# Patient Record
Sex: Female | Born: 1935 | Race: Black or African American | Hispanic: No | Smoking: Never smoker
Health system: Southern US, Community
[De-identification: ages and names within clinical notes are randomized; demographics above are authoritative.]

## PROBLEM LIST (undated history)

## (undated) DIAGNOSIS — N289 Disorder of kidney and ureter, unspecified: Secondary | ICD-10-CM

## (undated) DIAGNOSIS — I509 Heart failure, unspecified: Secondary | ICD-10-CM

## (undated) DIAGNOSIS — I10 Essential (primary) hypertension: Secondary | ICD-10-CM

## (undated) DIAGNOSIS — I4891 Unspecified atrial fibrillation: Secondary | ICD-10-CM

## (undated) DIAGNOSIS — N189 Chronic kidney disease, unspecified: Secondary | ICD-10-CM

## (undated) DIAGNOSIS — I499 Cardiac arrhythmia, unspecified: Secondary | ICD-10-CM

## (undated) DIAGNOSIS — E669 Obesity, unspecified: Secondary | ICD-10-CM

## (undated) DIAGNOSIS — E119 Type 2 diabetes mellitus without complications: Secondary | ICD-10-CM

## (undated) HISTORY — PX: FRACTURE SURGERY: SHX138

## (undated) HISTORY — PX: HIP FRACTURE SURGERY: SHX118

## (undated) HISTORY — PX: TUBAL LIGATION: SHX77

---

## 2002-05-18 ENCOUNTER — Emergency Department (HOSPITAL_COMMUNITY): Admission: EM | Admit: 2002-05-18 | Discharge: 2002-05-18 | Payer: Self-pay | Admitting: Emergency Medicine

## 2002-10-18 ENCOUNTER — Ambulatory Visit (HOSPITAL_COMMUNITY): Admission: RE | Admit: 2002-10-18 | Discharge: 2002-10-18 | Payer: Self-pay | Admitting: *Deleted

## 2002-10-18 ENCOUNTER — Encounter: Payer: Self-pay | Admitting: *Deleted

## 2003-01-23 ENCOUNTER — Emergency Department (HOSPITAL_COMMUNITY): Admission: EM | Admit: 2003-01-23 | Discharge: 2003-01-23 | Payer: Self-pay | Admitting: Emergency Medicine

## 2004-06-15 ENCOUNTER — Emergency Department (HOSPITAL_COMMUNITY): Admission: EM | Admit: 2004-06-15 | Discharge: 2004-06-15 | Payer: Self-pay | Admitting: Emergency Medicine

## 2004-07-19 ENCOUNTER — Emergency Department (HOSPITAL_COMMUNITY): Admission: EM | Admit: 2004-07-19 | Discharge: 2004-07-19 | Payer: Self-pay | Admitting: Emergency Medicine

## 2005-10-15 ENCOUNTER — Emergency Department (HOSPITAL_COMMUNITY): Admission: EM | Admit: 2005-10-15 | Discharge: 2005-10-15 | Payer: Self-pay | Admitting: Emergency Medicine

## 2008-03-03 ENCOUNTER — Emergency Department (HOSPITAL_COMMUNITY): Admission: EM | Admit: 2008-03-03 | Discharge: 2008-03-03 | Payer: Self-pay | Admitting: Emergency Medicine

## 2008-03-05 ENCOUNTER — Inpatient Hospital Stay (HOSPITAL_COMMUNITY): Admission: EM | Admit: 2008-03-05 | Discharge: 2008-03-07 | Payer: Self-pay | Admitting: Emergency Medicine

## 2008-10-27 ENCOUNTER — Inpatient Hospital Stay: Payer: Self-pay | Admitting: Unknown Physician Specialty

## 2008-11-01 ENCOUNTER — Inpatient Hospital Stay: Admission: RE | Admit: 2008-11-01 | Discharge: 2008-12-11 | Payer: Self-pay | Admitting: Internal Medicine

## 2008-11-06 ENCOUNTER — Ambulatory Visit (HOSPITAL_COMMUNITY): Payer: Self-pay | Admitting: Internal Medicine

## 2008-11-06 ENCOUNTER — Encounter (HOSPITAL_COMMUNITY): Admission: RE | Admit: 2008-11-06 | Discharge: 2008-11-07 | Payer: Self-pay | Admitting: Internal Medicine

## 2009-05-26 ENCOUNTER — Emergency Department (HOSPITAL_COMMUNITY): Admission: EM | Admit: 2009-05-26 | Discharge: 2009-05-26 | Payer: Self-pay | Admitting: Emergency Medicine

## 2010-11-03 ENCOUNTER — Encounter: Payer: Self-pay | Admitting: Nurse Practitioner

## 2011-01-11 IMAGING — CR DG HIP COMPLETE 2+V*L*
1 series · 2 of 2 positions shown · non-contrast
Comparison: none

REASON FOR EXAM: fell on [REDACTED],c/o left hip pain
COMMENTS:   LMP: Post-Menopausal

PROCEDURE:     DXR - DXR HIP LEFT COMPLETE  - October 27, 2008  [DATE]
RESULT:     A subcapital femoral neck fracture is appreciated demonstrating
impaction and lateral angulation.

[Series 1: view not recorded · 0.17mm/px · 2 of 2 slices shown]
[im 1/2]
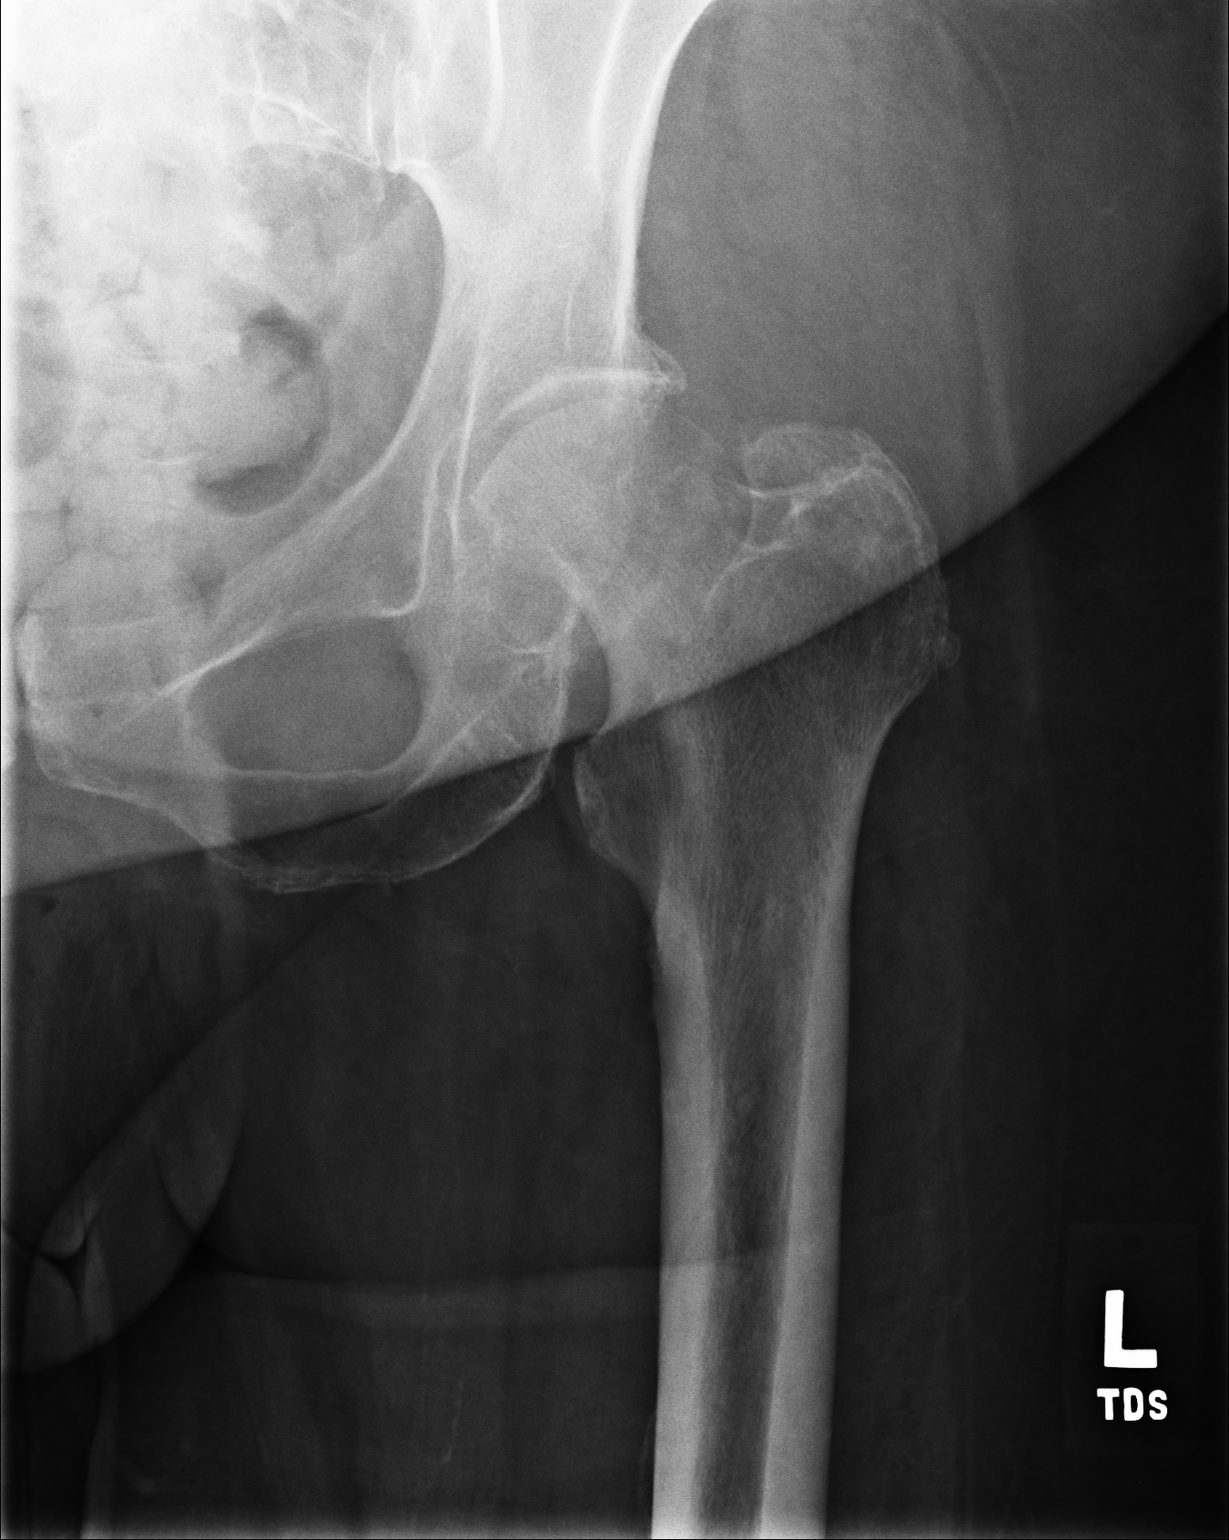
[im 2/2]
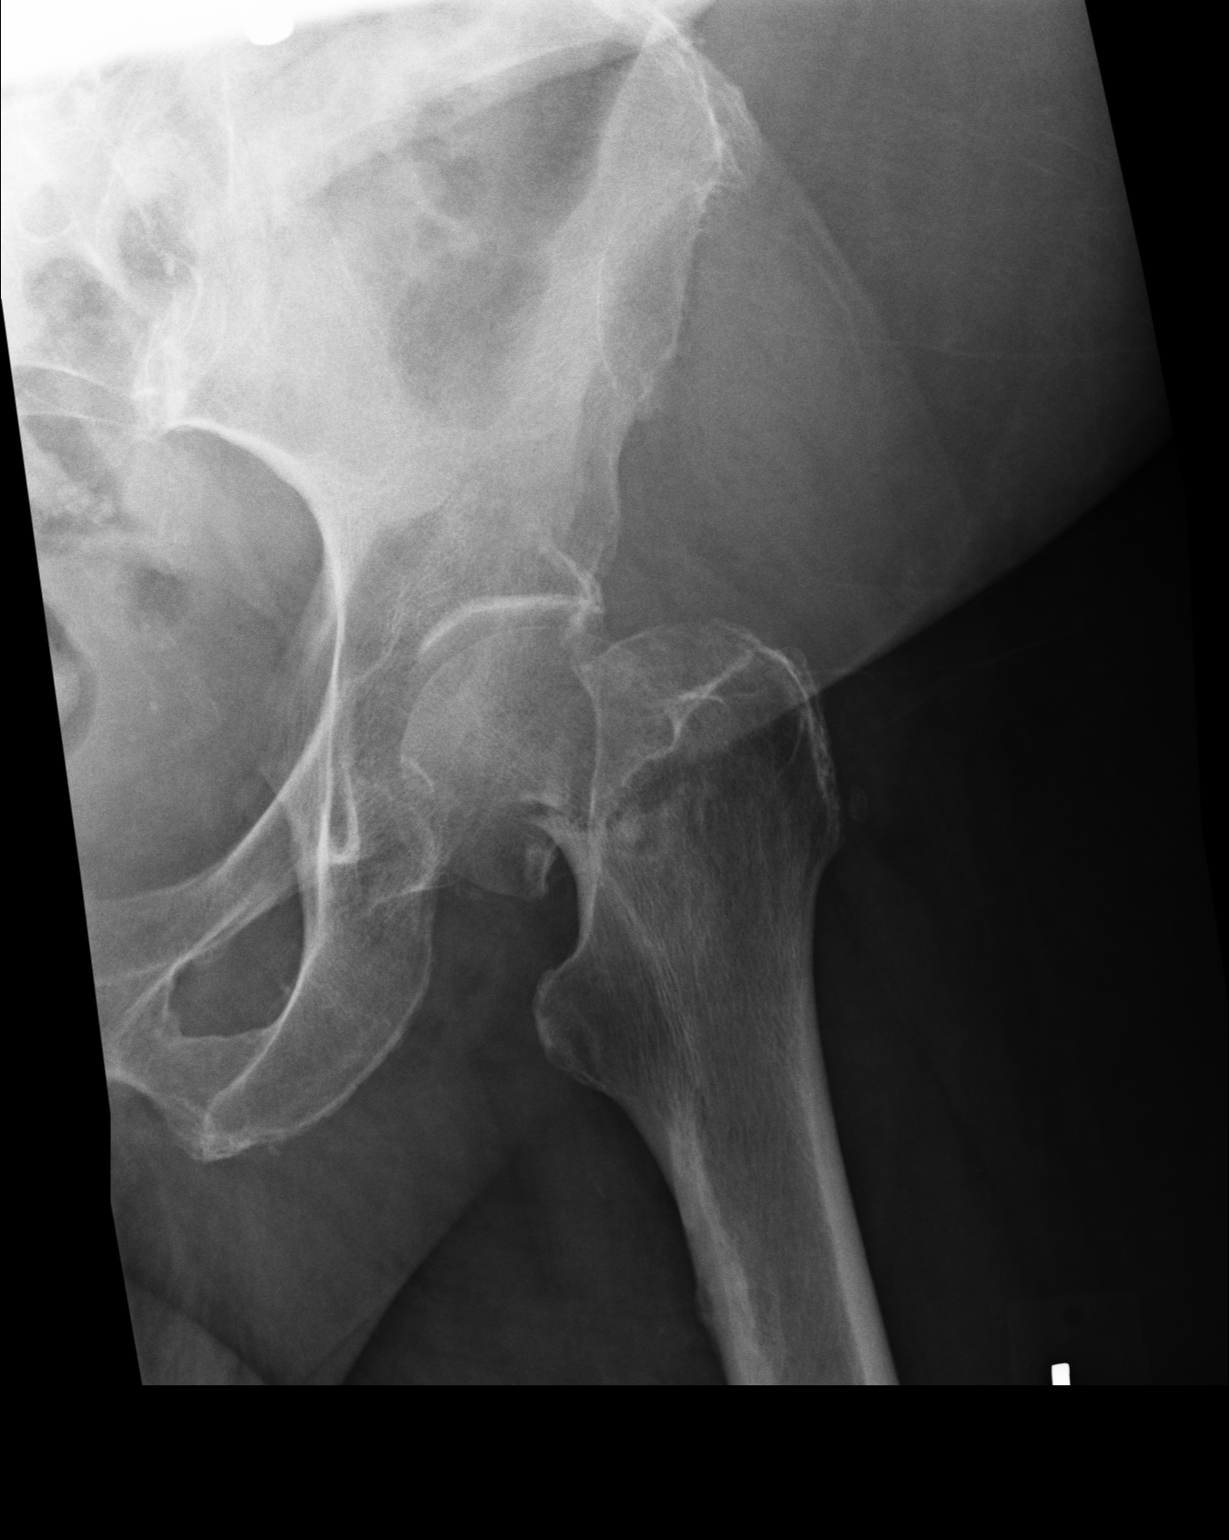

[2 of 2 positions shown; findings below may reference images not displayed]

IMPRESSION: 1. Subcapital femoral neck fracture as described above.

## 2011-01-11 IMAGING — CR PELVIS - 1-2 VIEW
1 series · 1 of 1 positions shown · non-contrast
Comparison: none

REASON FOR EXAM: fell [REDACTED] c/o left hip pain [HOSPITAL]
COMMENTS:

PROCEDURE:     DXR - DXR PELVIS AP ONLY  - October 27, 2008  [DATE]
RESULT:     A subcapital femoral neck fracture is appreciated involving the
LEFT hip. No further fractures or dislocations are appreciated. A moderate
amount of stool is appreciated within the colon.

[view not recorded]
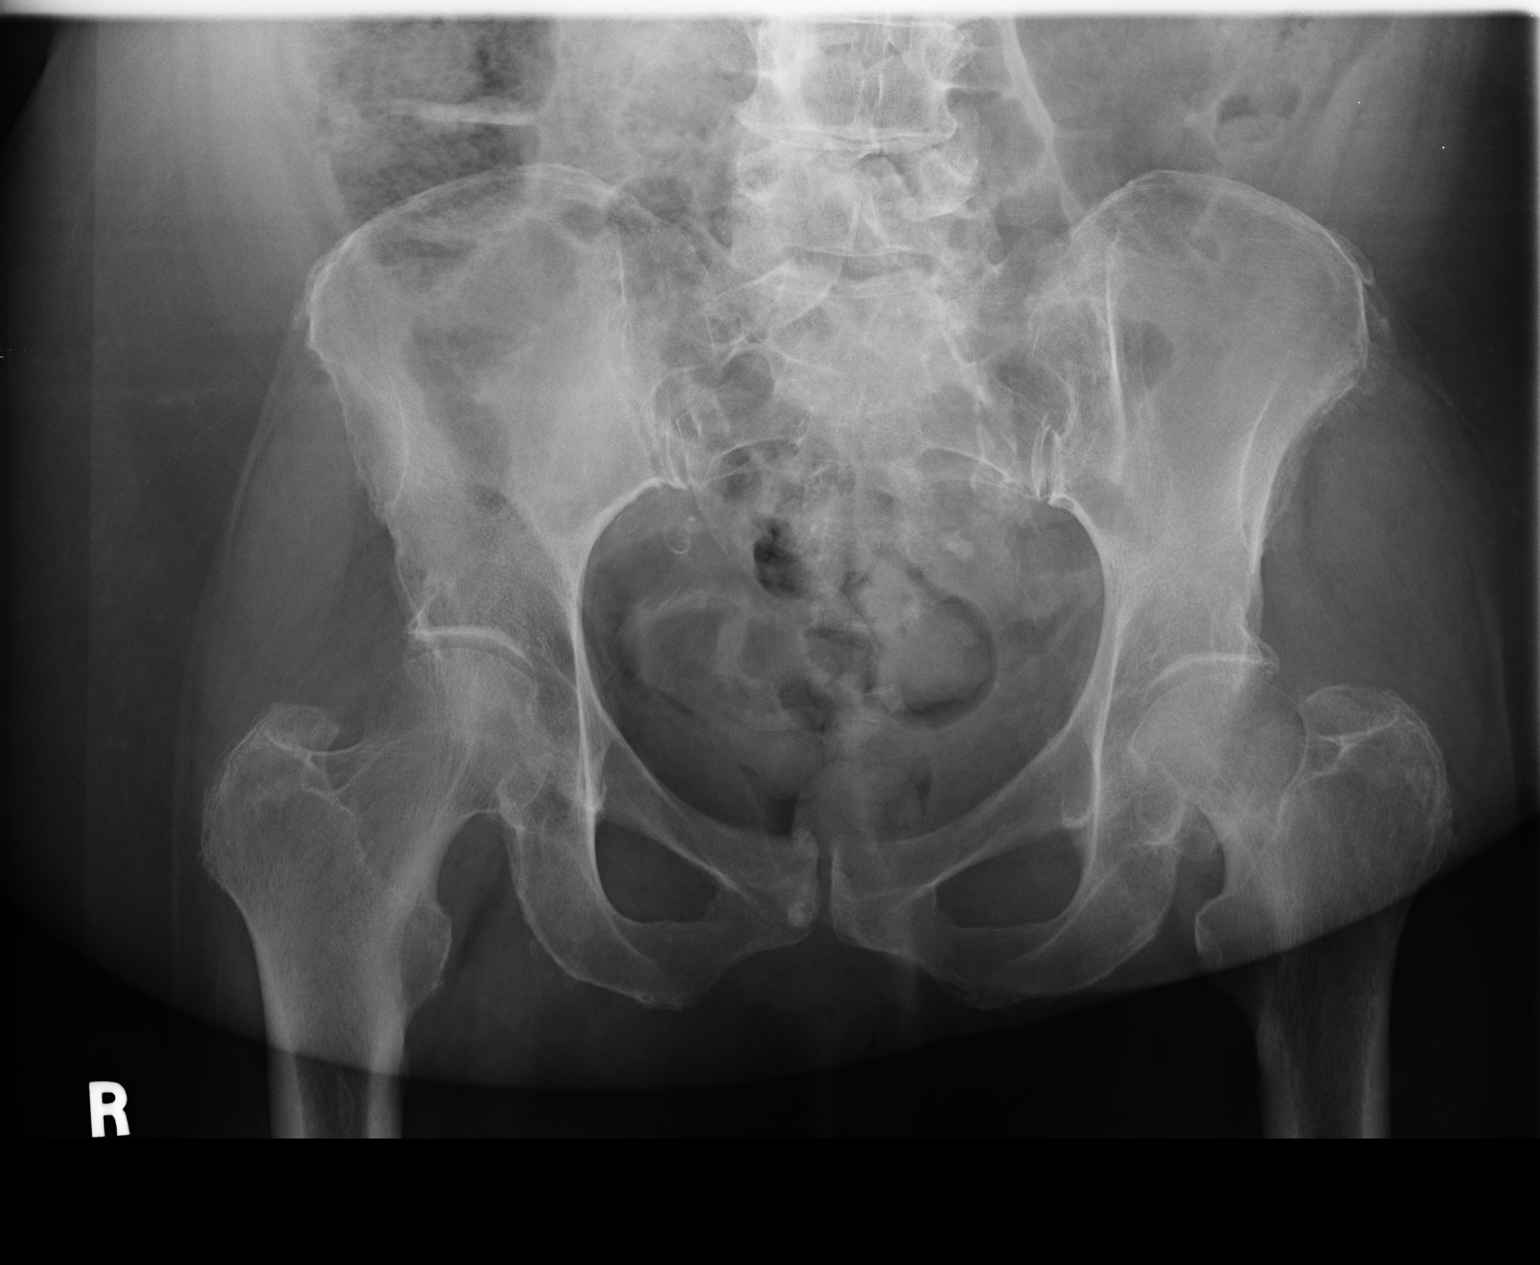

[1 of 1 positions shown; findings below may reference images not displayed]

IMPRESSION: 1. LEFT subcapital femoral neck fracture.

## 2011-01-11 IMAGING — CR DG CHEST 1V PORT
1 series · 1 of 1 positions shown · non-contrast
Comparison: none

REASON FOR EXAM: pre op hip fx  rm 17
COMMENTS:

PROCEDURE:     DXR - DXR PORTABLE CHEST SINGLE VIEW  - October 27, 2008  [DATE]
RESULT:     No focal regions of consolidation or focal infiltrates are
appreciated. The cardiac silhouette is moderately enlarged. The visualized
bony skeleton is unremarkable.

[view not recorded]
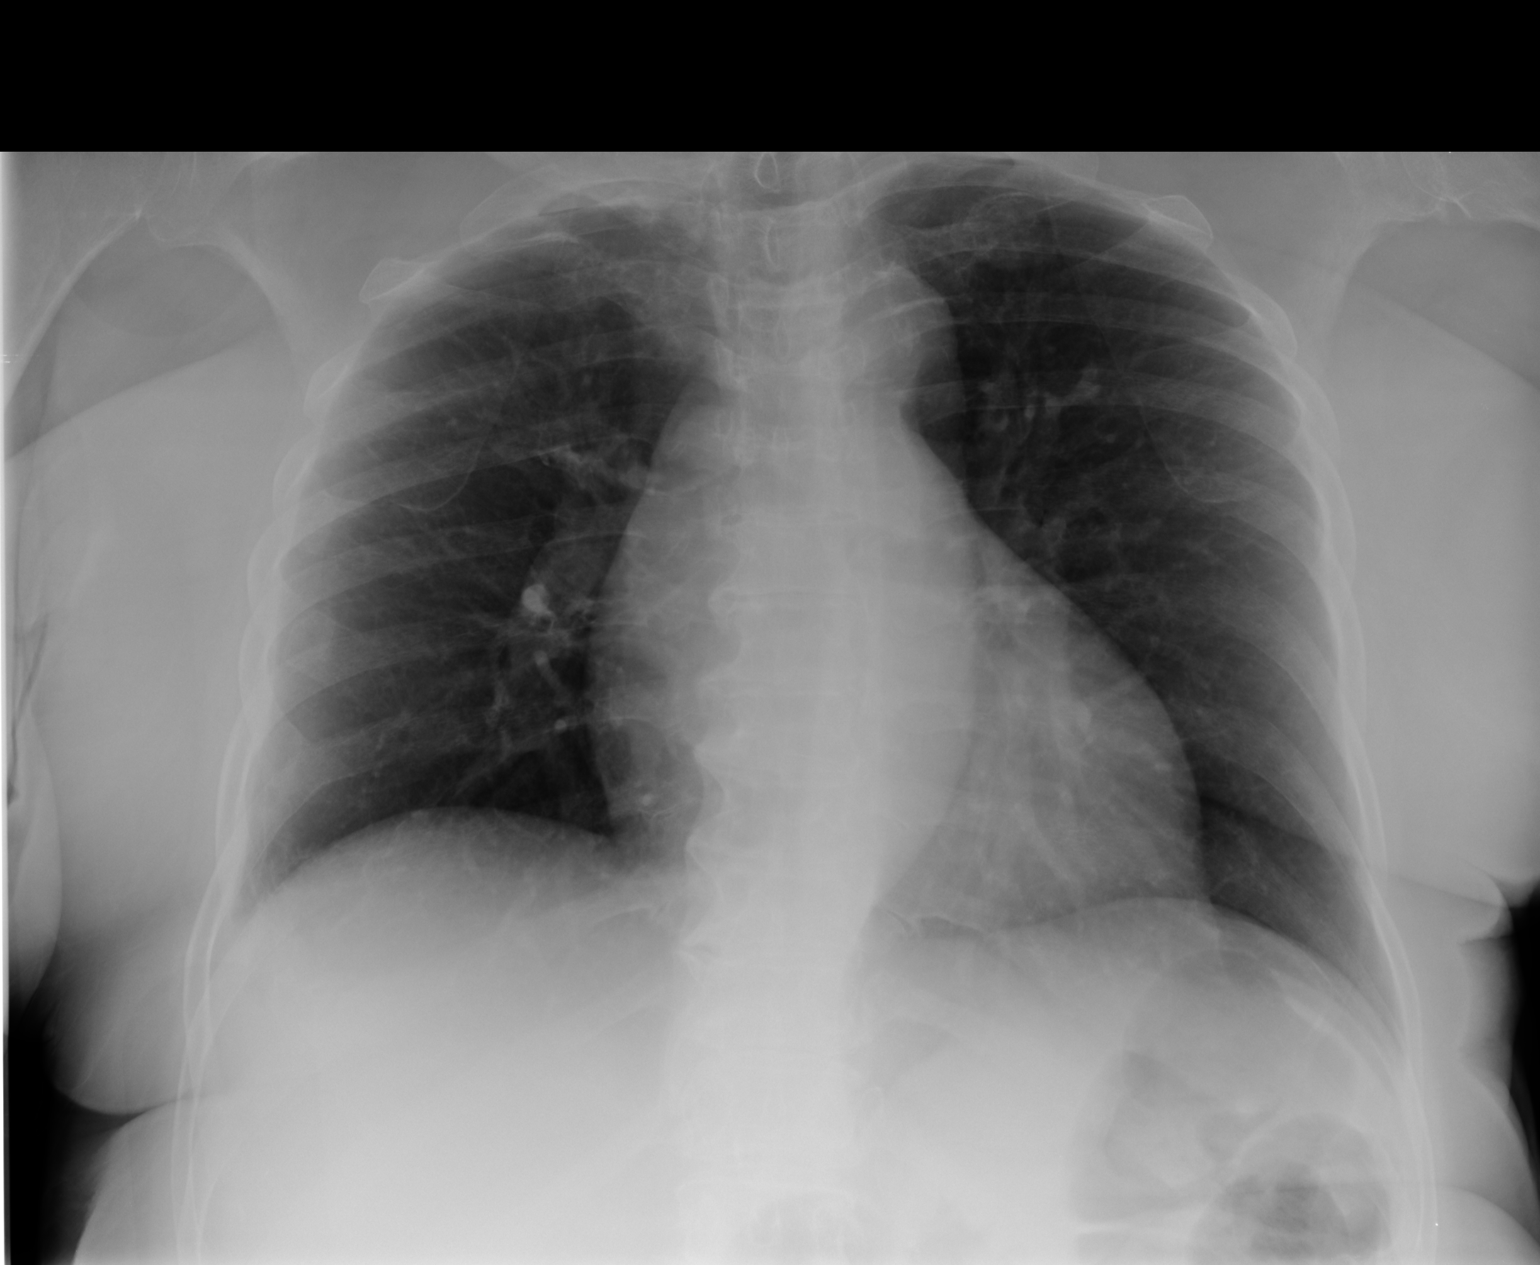

[1 of 1 positions shown; findings below may reference images not displayed]

IMPRESSION: 1. Chest radiograph without evidence of acute cardiopulmonary disease.

## 2011-01-12 IMAGING — CR PELVIS - 1-2 VIEW
1 series · 1 of 1 positions shown · non-contrast
Comparison: none

REASON FOR EXAM: postop
COMMENTS:

PROCEDURE:     DXR - DXR PELVIS AP ONLY  - October 28, 2008  [DATE]
RESULT:     The patient is status post LEFT hip prosthesis. The hardware
appears intact without evidence of loosening or failure. The native osseous
structures demonstrate no evidence of fracture or dislocation.

[view not recorded]
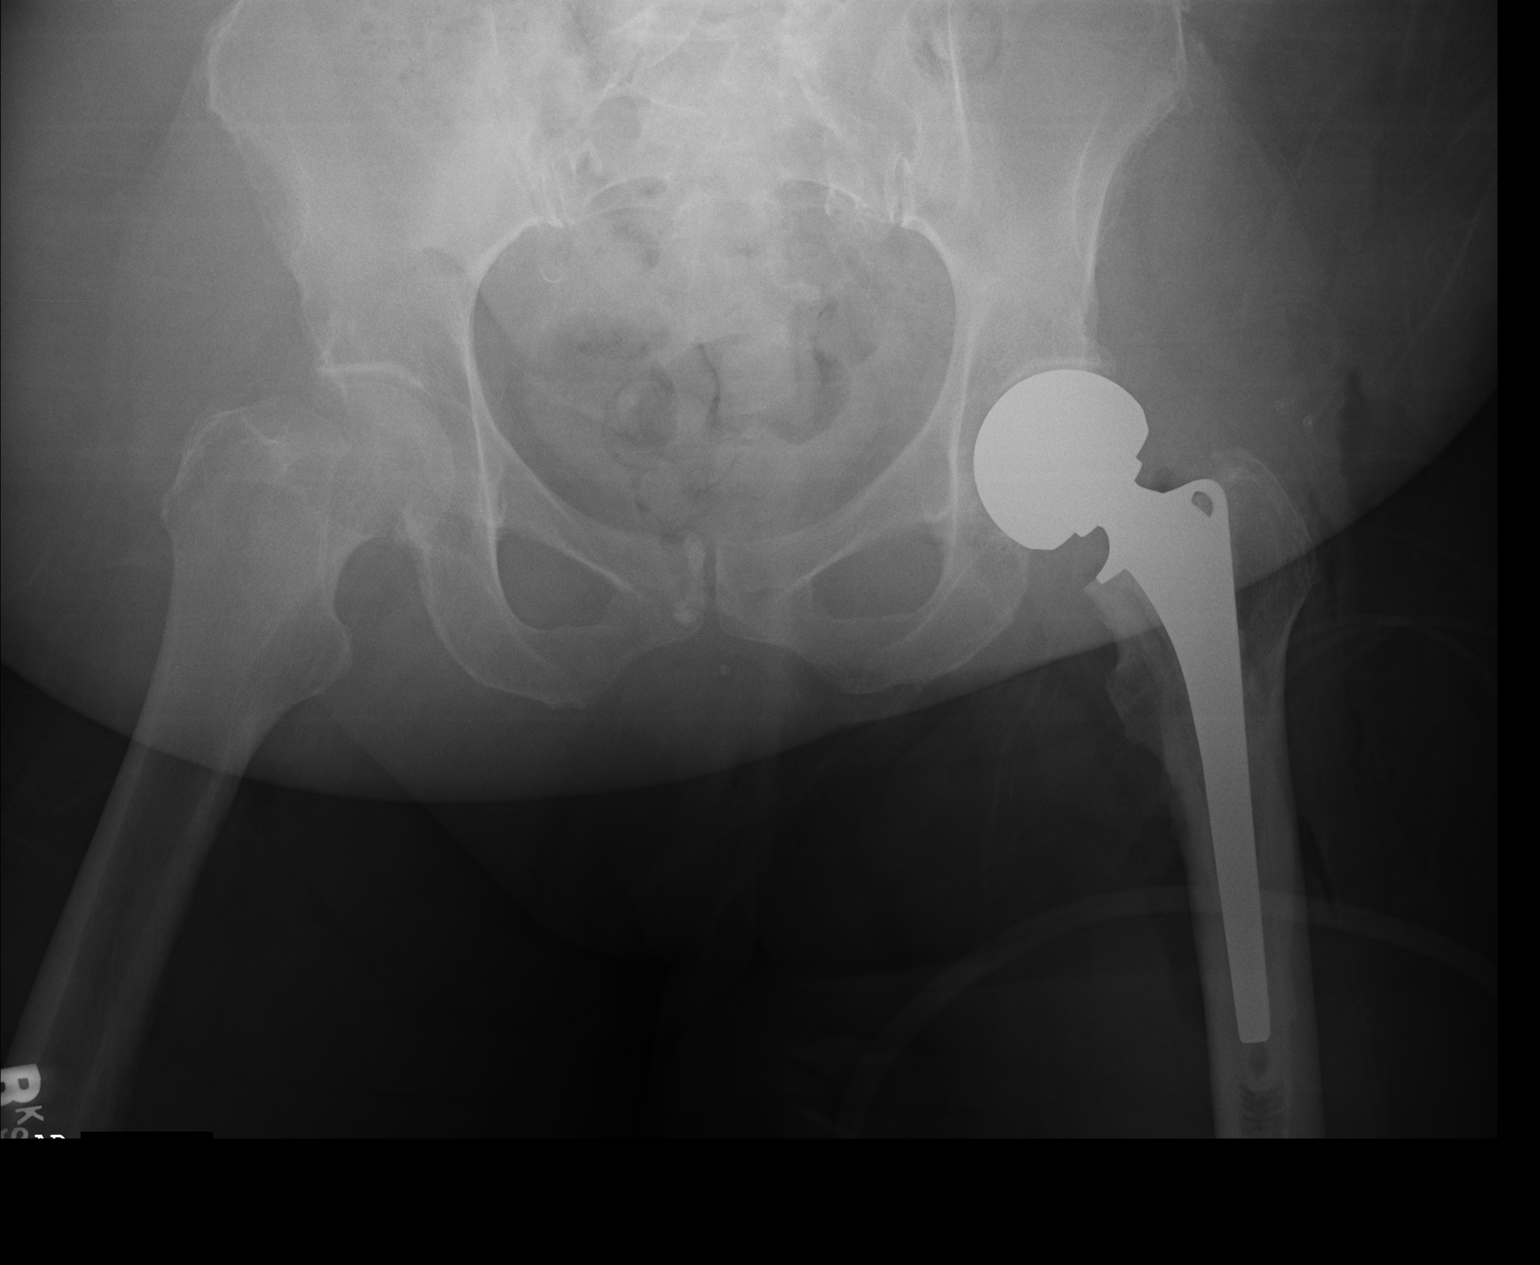

[1 of 1 positions shown; findings below may reference images not displayed]

IMPRESSION: 1. LEFT hip prosthesis as described above.

## 2011-01-22 LAB — GLUCOSE, CAPILLARY

## 2011-01-26 LAB — GLUCOSE, CAPILLARY
Comment 1: 254331
Comment 1: 254331
Glucose-Capillary: 145 mg/dL — ABNORMAL HIGH (ref 70–99)
Glucose-Capillary: 151 mg/dL — ABNORMAL HIGH (ref 70–99)
Glucose-Capillary: 173 mg/dL — ABNORMAL HIGH (ref 70–99)
Glucose-Capillary: 174 mg/dL — ABNORMAL HIGH (ref 70–99)
Glucose-Capillary: 180 mg/dL — ABNORMAL HIGH (ref 70–99)
Glucose-Capillary: 197 mg/dL — ABNORMAL HIGH (ref 70–99)
Glucose-Capillary: 214 mg/dL — ABNORMAL HIGH (ref 70–99)
Glucose-Capillary: 222 mg/dL — ABNORMAL HIGH (ref 70–99)
Glucose-Capillary: 223 mg/dL — ABNORMAL HIGH (ref 70–99)
Glucose-Capillary: 225 mg/dL — ABNORMAL HIGH (ref 70–99)
Glucose-Capillary: 251 mg/dL — ABNORMAL HIGH (ref 70–99)
Glucose-Capillary: 260 mg/dL — ABNORMAL HIGH (ref 70–99)
Glucose-Capillary: 268 mg/dL — ABNORMAL HIGH (ref 70–99)
Glucose-Capillary: 276 mg/dL — ABNORMAL HIGH (ref 70–99)
Glucose-Capillary: 278 mg/dL — ABNORMAL HIGH (ref 70–99)
Glucose-Capillary: 281 mg/dL — ABNORMAL HIGH (ref 70–99)
Glucose-Capillary: 282 mg/dL — ABNORMAL HIGH (ref 70–99)
Glucose-Capillary: 294 mg/dL — ABNORMAL HIGH (ref 70–99)
Glucose-Capillary: 310 mg/dL — ABNORMAL HIGH (ref 70–99)
Glucose-Capillary: 314 mg/dL — ABNORMAL HIGH (ref 70–99)
Glucose-Capillary: 315 mg/dL — ABNORMAL HIGH (ref 70–99)
Glucose-Capillary: 320 mg/dL — ABNORMAL HIGH (ref 70–99)
Glucose-Capillary: 47 mg/dL — ABNORMAL LOW (ref 70–99)
Glucose-Capillary: 488 mg/dL — ABNORMAL HIGH (ref 70–99)
Glucose-Capillary: 57 mg/dL — ABNORMAL LOW (ref 70–99)
Glucose-Capillary: 59 mg/dL — ABNORMAL LOW (ref 70–99)
Glucose-Capillary: 61 mg/dL — ABNORMAL LOW (ref 70–99)
Glucose-Capillary: 72 mg/dL (ref 70–99)
Glucose-Capillary: 73 mg/dL (ref 70–99)
Glucose-Capillary: 90 mg/dL (ref 70–99)
Glucose-Capillary: 97 mg/dL (ref 70–99)

## 2011-01-26 LAB — CROSSMATCH

## 2011-01-26 LAB — ABO/RH: ABO/RH(D): O POS

## 2011-01-26 LAB — HEMOGLOBIN AND HEMATOCRIT, BLOOD: HCT: 26 % — ABNORMAL LOW (ref 36.0–46.0)

## 2011-01-27 LAB — GLUCOSE, CAPILLARY
Glucose-Capillary: 108 mg/dL — ABNORMAL HIGH (ref 70–99)
Glucose-Capillary: 111 mg/dL — ABNORMAL HIGH (ref 70–99)
Glucose-Capillary: 119 mg/dL — ABNORMAL HIGH (ref 70–99)
Glucose-Capillary: 120 mg/dL — ABNORMAL HIGH (ref 70–99)
Glucose-Capillary: 121 mg/dL — ABNORMAL HIGH (ref 70–99)
Glucose-Capillary: 125 mg/dL — ABNORMAL HIGH (ref 70–99)
Glucose-Capillary: 137 mg/dL — ABNORMAL HIGH (ref 70–99)
Glucose-Capillary: 138 mg/dL — ABNORMAL HIGH (ref 70–99)
Glucose-Capillary: 150 mg/dL — ABNORMAL HIGH (ref 70–99)
Glucose-Capillary: 164 mg/dL — ABNORMAL HIGH (ref 70–99)
Glucose-Capillary: 171 mg/dL — ABNORMAL HIGH (ref 70–99)
Glucose-Capillary: 180 mg/dL — ABNORMAL HIGH (ref 70–99)
Glucose-Capillary: 183 mg/dL — ABNORMAL HIGH (ref 70–99)
Glucose-Capillary: 189 mg/dL — ABNORMAL HIGH (ref 70–99)
Glucose-Capillary: 198 mg/dL — ABNORMAL HIGH (ref 70–99)
Glucose-Capillary: 203 mg/dL — ABNORMAL HIGH (ref 70–99)
Glucose-Capillary: 215 mg/dL — ABNORMAL HIGH (ref 70–99)
Glucose-Capillary: 216 mg/dL — ABNORMAL HIGH (ref 70–99)
Glucose-Capillary: 221 mg/dL — ABNORMAL HIGH (ref 70–99)
Glucose-Capillary: 237 mg/dL — ABNORMAL HIGH (ref 70–99)
Glucose-Capillary: 246 mg/dL — ABNORMAL HIGH (ref 70–99)
Glucose-Capillary: 254 mg/dL — ABNORMAL HIGH (ref 70–99)
Glucose-Capillary: 255 mg/dL — ABNORMAL HIGH (ref 70–99)
Glucose-Capillary: 297 mg/dL — ABNORMAL HIGH (ref 70–99)
Glucose-Capillary: 301 mg/dL — ABNORMAL HIGH (ref 70–99)
Glucose-Capillary: 389 mg/dL — ABNORMAL HIGH (ref 70–99)
Glucose-Capillary: 60 mg/dL — ABNORMAL LOW (ref 70–99)
Glucose-Capillary: 82 mg/dL (ref 70–99)
Glucose-Capillary: 100 mg/dL — ABNORMAL HIGH (ref 70–99)
Glucose-Capillary: 110 mg/dL — ABNORMAL HIGH (ref 70–99)
Glucose-Capillary: 111 mg/dL — ABNORMAL HIGH (ref 70–99)
Glucose-Capillary: 112 mg/dL — ABNORMAL HIGH (ref 70–99)
Glucose-Capillary: 170 mg/dL — ABNORMAL HIGH (ref 70–99)
Glucose-Capillary: 196 mg/dL — ABNORMAL HIGH (ref 70–99)
Glucose-Capillary: 208 mg/dL — ABNORMAL HIGH (ref 70–99)
Glucose-Capillary: 72 mg/dL (ref 70–99)
Glucose-Capillary: 89 mg/dL (ref 70–99)

## 2011-02-24 NOTE — Discharge Summary (Signed)
NAME:  [name], [name]                 ACCOUNT NO.:  [id number]   MEDICAL RECORD NO.:  [id number]          PATIENT TYPE:  INP   LOCATION:  A326                          FACILITY:  [facility]   PHYSICIAN:  Dorris Singh, DO    DATE OF BIRTH:  1933/01/18   DATE OF ADMISSION:  03/04/2008  DATE OF DISCHARGE:  05/27/2009LH                               DISCHARGE SUMMARY   ADMISSION DIAGNOSES:  1. Hypoglycemia with blood sugar as low as 18.  2. Recurrent fall secondary to hypoglycemia.  3. Type 2 diabetes.  4. Rib fractures.   DISCHARGE DIAGNOSES:  1. Hyperglycemia which is resolved.  2. Right rib fractures.  3. Diabetes.   PRIMARY CARE PHYSICIAN:  [facility].   RADIOLOGY TESTS:  That were done include on May 25 unilateral rib which  showed acute right rib fractures and extrapleural hematoma on the right.  Portable chest x-ray on May 26 demonstrated rib fractures again noted,  no pneumothorax, cardiomegaly and basilar atelectasis.  A chest x-ray on  May 27 demonstrated stable right effusion and pleural thickening.  No  pneumothorax with known rib fractures.   Her H and P was done by Dr. Rito Ehrlich.  To summarize, the patient is a 77-  year-old [place] female who experienced several episodes of low  blood sugars.  She came to [facility] and then was sent home and then  returned with a complaint of hypoglycemia.  Also stated that she noticed  that this had started to occur once her Lantus dose was changed and  increased.  The patient was then started on D5 IV fluids.  Also she was  taken off all her hypoglycemic agents.  At that point in time she  continued to do well.  She experienced some pain with her rib fractures  but that has improved.  Today her blood sugars have been over 140, also  with the highest being at 395.  She has been covered on a sliding scale.  The patient is stable enough to discharge back to home.   MEDICATIONS:  1. Her medications that she will go home on  include aspirin 81 mg p.o.      daily.  2. Metformin 500 mg two p.o. b.i.d.  3. Fexofenadine 180 mg as needed.  4. HCTZ 25 mg daily.  5. Glipizide 10 mg twice a day.  6. Lisinopril 40 mg daily.   The change in her medication will be Lantus 30 units in the a.m. and 30  units at night.  This is only a temporary measure until she is seen by  her primary care physician to adjust her medication accordingly possibly  with more oral hyperglycemics due to the patient's intolerance to the  current Lantus dose upon admission.   CONDITION:  Stable.  Her vitals are stable and her lab work is  acceptable for discharge.   DISCHARGE PLAN:  Will be for the patient to follow up with primary care  physician in 1-3 days.  We will set up an appointment for her.  Also to  come back if any  problems worsen for her rib fractures.  She will use  Tylenol for her rib fracture, she will use Tylenol for pain as directed.  Also no heavy lifting for up to 6 weeks and will make sure the patient  has an incentive spirometer.  I have recommended home health care to the  patient at this point in time to help her with activities of daily  living.  She has refused.  She states that she has family member that  can help her.   DISPOSITION:  To home.      Dorris Singh, DO  Electronically Signed     CB/MEDQ  D:  03/07/2008  T:  03/07/2008  Job:  [id number]   cc:   [facility]

## 2011-02-24 NOTE — H&P (Signed)
NAME:  [name] [name], [name] [name]                 ACCOUNT NO.:  [id number]   MEDICAL RECORD NO.:  [id number]          PATIENT TYPE:  INP   LOCATION:  A326                          FACILITY:  [facility]   PHYSICIAN:  Margaretmary Dys, M.D.DATE OF BIRTH:  [birth year]   DATE OF ADMISSION:  03/05/2008  DATE OF DISCHARGE:  LH                              HISTORY & PHYSICAL   PRIMARY CARE PHYSICIAN:  [facility].   ADMITTING DIAGNOSES:  1. Hypoglycemia with blood sugar as low as 18 at home today.  2. Recurrent falls secondary to hypoglycemia.  3. Type 2 diabetes mellitus.   CHIEF COMPLAINT:  Low blood sugar and recurrent falls over the past 5  days.   HISTORY OF PRESENT ILLNESS:  Ms. Boies is a 77 year old female who was  brought into the emergency room by the EMS.  Apparently, the patient  lives with her daughter and over the last 5 days since last Wednesday to  be exact, the patient has noticed that she has been having low blood  sugars.  The lowest was 18.  She has also had 42 at various times.  She  said these episodes are accompanied by light headedness and she actually  took a couple of falls.  She presented to the hospital on Mar 03, 2008,  at which time she was discharged.  However, the patient presented again  yesterday after she was brought in by the EMS.  The patient recently had  her Lantus insulin changed and a higher dose prescribed.  This was about  2 weeks ago.  The patient suspects that ever since then she has been  having these symptoms of light headedness and symptoms very suggestive  of hypoglycemia.  The patient has been a diabetic since 1986, and has  not had any significant hospital admissions and overall has been doing  fairly well and remains very active and working.  The patient denies any  chest pain.  She has no nausea or vomiting.  She has no abdominal pain.  She has no headaches.  She has had no fevers or chills, no rigors, no  frequency, urgency or  dysuria.   She has no recent weight loss, no generalized weakness or change in her  appetite.  She denies any cough.  No dyspnea, no wheezing, no tachypnea.   REVIEW OF SYSTEMS:  Ten point of review of systems otherwise negative,  except as mentioned in the history of present illness above.   PAST MEDICAL HISTORY:  1. Hypertension.  2. Diabetes.  3. Dyslipidemia.  4. Morbid obesity.   MEDICATIONS:  1. Lantus insulin subcutaneous 65 units in the morning and 50 units in      the evening.  2. Lisinopril 40 mg p.o. once a day.  3. Metformin 1000 mg p.o. b.i.d.  4. Hydrochlorothiazide 25 mg p.o. q.a.m. in the morning.  5. Fexofenadine 180 mg p.o. p.r.n., this is for allergy.  6. Glipizide 10 mg p.o. b.i.d.  7. Aspirin 81 mg p.o. once a day.  8. Lipitor once a day.   ALLERGIES:  THE PATIENT  HAS NO KNOWN DRUG ALLERGIES.   FAMILY HISTORY:  Father died of a heart attack at age 48.  Mother also  had diabetes and died of complications of diabetes.  No other  significant history of cancer was obtained.  The patient has two other  siblings, one of them has diabetes.   SOCIAL HISTORY:  The patient is single, lives with one of her daughters.  She has five children.  She is a lifelong nonsmoker and does not drink  alcohol.  No history of illicit drug use.  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   PHYSICAL EXAMINATION:  GENERAL:  The patient was conscious, alert,  comfortable, not in acute distress.  She was well oriented in time,  place and person.  Very pleasant.  VITAL SIGNS:  Blood pressure 120/68 with a pulse of 82, respiratory rate  16, temperature was 98.5 degrees Fahrenheit.  Oxygen saturation was 95%  on room air.  HEENT:  Normocephalic, atraumatic.  Oral mucosa was moist with no  exudates.  NECK:  Supple.  No JVD or lymphadenopathy.  LUNGS:  Clear with good air entry bilaterally.  HEART:  S1-S2 regular.  No S3-S4, gallops or rubs.  ABDOMEN:  Obese, but soft.  Nontender.  Bowel  sounds positive.  No  masses palpable.  EXTREMITIES:  Bilateral trace pedal edema with no calf induration or  tenderness noted.  The patient had full range of movement in all  extremities without any evidence of fracture.  No bruises or abrasions  were noted.  CNS:  Grossly intact with no focal neurological deficits.   LABORATORY/DIAGNOSTIC DATA:  White blood cell count was 10.4, hemoglobin  9.6, hematocrit 29.1, platelet count 354 with no left shift.  Sodium  140, potassium 3.5, chloride 106, CO2 was 30, glucose 92, BUN 21,  creatinine was 1.04.  AST 32, ALT 37, albumin 2.8, calcium 9.1.  Please  note that the patient's blood sugar when the EMS arrived in her home was  18.   ASSESSMENT:  1. Recurrent falls secondary to hypoglycemia.  2. Eminent hypoglycemic coma today.  3. Altered mental status secondary to hypoglycemia, the patient was      not aware until she arrived in the hospital.  Her blood sugar was      18 anyway.  4. Severe hypoglycemic episodes.  5. History of diabetes mellitus with recent adjustment in her Lantus      insulin dose.   PLAN:  1. The patient's Lantus insulin and oral hypoglycemic agents will be      put on hold at this time.  The patient said she has been taking the      oral hypoglycemic agents for several years.  The only new change      was her insulin.  I suspect that it is likely due to her insulin,      but until we are able to get her blood sugar to a reasonable level      of at least 200, I will stop all her hypoglycemic medications at      this time.  2. The patient's IV fluids will be changed to 5% dextrose to run at 75      mL.  We will monitor her blood sugars every hour.  I will be      notified if her blood sugar goes below 60, for which we will      activate the hypoglycemic protocol and give her 50% dextrose.  If      it goes above 200, I will stop the dextrose infusion and slowly      restart her back on her insulin at much lower  doses.  3. I will obtaining a hemoglobin A1c on her to see what her control      has been over the past 3 months to ensure that the patient has not      had undesirably aggressive control.  4. The patient does not have any evidence of fractures, bruises or      abrasions from her fall.  5. The patient's neurological status is pretty intact at this time.  I      do not see any evidence of hypoglycemic encephalopathy.  6. The patient to be on DVT prophylaxis with Lovenox.  7. I will resume all of her other medications, including her blood      pressure medications and aspirin.   DISPOSITION:  I have discussed the above plan with the patient.  She was  fairly lucid and she did understand that I be putting her diabetic  medications on hold.  Unfortunately, he daughter was not in the room  when I saw her.  I will discuss with the family members once they are  available.   CODE STATUS:  The patient is a full code.      Margaretmary Dys, M.D.  Electronically Signed     AM/MEDQ  D:  03/05/2008  T:  03/05/2008  Job:  [id number]

## 2011-02-24 NOTE — Group Therapy Note (Signed)
NAME:  [name] [name]                 ACCOUNT NO.:  [id number]   MEDICAL RECORD NO.:  [id number]          PATIENT TYPE:  INP   LOCATION:  A326                          FACILITY:  [facility]   PHYSICIAN:  Osvaldo Shipper, MD     DATE OF BIRTH:  1932-10-22   DATE OF PROCEDURE:  03/06/2008  DATE OF DISCHARGE:                                 PROGRESS NOTE   SUBJECTIVE:  Patient is complaining of severe pain in the right chest  wall.  She denies any shortness of breath at this time.   OBJECTIVE FINDINGS:  Vital signs continue to be stable.  Blood pressure  is little bit high 151/76.  CBGs again dropped last night to 55 and the  last reading is 83 at 6:00 a.m. this morning.  No other labs are  available today.  Chest x-ray from this morning again shows rib  fractures on the right side with small amount of subjacent fluid  compatible with small hemorrhage.  No pneumothorax was noted.  The ribs  involved are 4, 5 and 6.   ASSESSMENT/PLAN:  1. Hypoglycemia.  This is thought to be secondary to a higher dose of      Lantus and change in the brand of the insulin, glargine, that was      used recently on this patient.  She continues to be hypoglycemic      off of the D5.  So she was reinitiated on the D5 last night.  We      will continue with this treatment of regimen this day.  2. Rib fractures because of a fall that she sustained when she was      hypoglycemic and had a syncopal episode.  No pneumothorax has been      noted.  We will give her around-the-clock pain medication so that      pain is controlled.  She is an elderly patient and is at risk for      pneumonia.  I do not want to utilize any nonsteroidal anti-      inflammatory drugs as she is at risk for renal failure.  The above      has been discussed with the patient.  3. Insulin-dependent diabetes, stable.  Her HbA1c is 11, so her      diabetes has not been well controlled an outpatient but obviously      the Lantus caused significant  hypoglycemia, so another treatment      regimen may have to be considered.  I think I would probably start      out with a lower dose of Lantus when she is no longer hypoglycemic.      She was also on oral hypoglycemic agents which are also held at      this time.  4. She also has a history of hypertension which is stable.  Otherwise      she is a fairly healthy person.   Because of the small amount of hemorrhage noted on the chest x-ray, we  will go ahead and discontinue her Lovenox and aspirin  so as not to  exacerbate these hemorrhages.   The patient will have to be changed from an observation to admit and we  will continue to follow her and I am hoping she will be able to go home  in the next day or two.      Osvaldo Shipper, MD  Electronically Signed     GK/MEDQ  D:  03/06/2008  T:  03/06/2008  Job:  [id number]

## 2011-07-08 LAB — DIFFERENTIAL
Basophils Absolute: 0
Basophils Absolute: 0
Basophils Relative: 0
Eosinophils Absolute: 0.2
Lymphocytes Relative: 21
Lymphs Abs: 1.9
Monocytes Relative: 10
Neutro Abs: 6
Neutro Abs: 6.7
Neutrophils Relative %: 64

## 2011-07-08 LAB — HEMOGLOBIN A1C: Hgb A1c MFr Bld: 11 — ABNORMAL HIGH

## 2011-07-08 LAB — CBC
HCT: 29.1 — ABNORMAL LOW
Hemoglobin: 9.6 — ABNORMAL LOW
Hemoglobin: 9.7 — ABNORMAL LOW
MCHC: 33.1
Platelets: 337
RBC: 3.55 — ABNORMAL LOW
RDW: 15.9 — ABNORMAL HIGH
WBC: 8.8

## 2011-07-08 LAB — BASIC METABOLIC PANEL
BUN: 19
CO2: 27
Calcium: 8.8
Calcium: 8.9
Calcium: 9.1
Chloride: 111
Creatinine, Ser: 0.92
GFR calc Af Amer: >60
GFR calc Af Amer: >60
GFR calc non Af Amer: 58 — ABNORMAL LOW
GFR calc non Af Amer: 60 — ABNORMAL LOW
Glucose, Bld: 87
Potassium: 3.7
Sodium: 135
Sodium: 138
Sodium: 143

## 2011-07-08 LAB — COMPREHENSIVE METABOLIC PANEL
ALT: 37 — ABNORMAL HIGH
Alkaline Phosphatase: 77
BUN: 21
CO2: 30
Chloride: 106
Glucose, Bld: 92
Potassium: 3.5
Sodium: 140
Total Bilirubin: 0.3

## 2011-07-08 LAB — GLUCOSE, RANDOM: Glucose, Bld: 373 — ABNORMAL HIGH

## 2015-02-25 ENCOUNTER — Emergency Department (HOSPITAL_COMMUNITY): Payer: Medicare HMO

## 2015-02-25 ENCOUNTER — Encounter (HOSPITAL_COMMUNITY): Payer: Self-pay | Admitting: *Deleted

## 2015-02-25 ENCOUNTER — Inpatient Hospital Stay (HOSPITAL_COMMUNITY)
Admission: EM | Admit: 2015-02-25 | Discharge: 2015-03-02 | DRG: 683 | Disposition: A | Discharge status: Home Health | Payer: Medicare HMO | Attending: Internal Medicine | Admitting: Internal Medicine

## 2015-02-25 DIAGNOSIS — N179 Acute kidney failure, unspecified: Secondary | ICD-10-CM | POA: Diagnosis present

## 2015-02-25 DIAGNOSIS — E669 Obesity, unspecified: Secondary | ICD-10-CM | POA: Diagnosis present

## 2015-02-25 DIAGNOSIS — Z794 Long term (current) use of insulin: Secondary | ICD-10-CM | POA: Diagnosis not present

## 2015-02-25 DIAGNOSIS — E860 Dehydration: Secondary | ICD-10-CM | POA: Diagnosis present

## 2015-02-25 DIAGNOSIS — E119 Type 2 diabetes mellitus without complications: Secondary | ICD-10-CM

## 2015-02-25 DIAGNOSIS — R1011 Right upper quadrant pain: Secondary | ICD-10-CM | POA: Diagnosis present

## 2015-02-25 DIAGNOSIS — K8020 Calculus of gallbladder without cholecystitis without obstruction: Secondary | ICD-10-CM | POA: Diagnosis present

## 2015-02-25 DIAGNOSIS — N390 Urinary tract infection, site not specified: Secondary | ICD-10-CM | POA: Diagnosis present

## 2015-02-25 DIAGNOSIS — K219 Gastro-esophageal reflux disease without esophagitis: Secondary | ICD-10-CM | POA: Diagnosis present

## 2015-02-25 DIAGNOSIS — N189 Chronic kidney disease, unspecified: Secondary | ICD-10-CM | POA: Diagnosis not present

## 2015-02-25 DIAGNOSIS — E1122 Type 2 diabetes mellitus with diabetic chronic kidney disease: Secondary | ICD-10-CM | POA: Diagnosis not present

## 2015-02-25 DIAGNOSIS — Z6841 Body Mass Index (BMI) 40.0 and over, adult: Secondary | ICD-10-CM | POA: Diagnosis not present

## 2015-02-25 DIAGNOSIS — I10 Essential (primary) hypertension: Secondary | ICD-10-CM | POA: Diagnosis present

## 2015-02-25 DIAGNOSIS — R109 Unspecified abdominal pain: Secondary | ICD-10-CM

## 2015-02-25 DIAGNOSIS — R52 Pain, unspecified: Secondary | ICD-10-CM

## 2015-02-25 DIAGNOSIS — K8010 Calculus of gallbladder with chronic cholecystitis without obstruction: Secondary | ICD-10-CM | POA: Diagnosis not present

## 2015-02-25 HISTORY — DX: Cardiac arrhythmia, unspecified: I49.9

## 2015-02-25 HISTORY — DX: Obesity, unspecified: E66.9

## 2015-02-25 HISTORY — DX: Essential (primary) hypertension: I10

## 2015-02-25 HISTORY — DX: Type 2 diabetes mellitus without complications: E11.9

## 2015-02-25 LAB — CBC WITH DIFFERENTIAL/PLATELET
BASOS PCT: 0 % (ref 0–1)
Basophils Absolute: 0 10*3/uL (ref 0.0–0.1)
EOS PCT: 2 % (ref 0–5)
Eosinophils Absolute: 0.1 10*3/uL (ref 0.0–0.7)
HEMATOCRIT: 35.6 % — AB (ref 36.0–46.0)
Hemoglobin: 11.2 g/dL — ABNORMAL LOW (ref 12.0–15.0)
LYMPHS PCT: 31 % (ref 12–46)
Lymphs Abs: 2.5 10*3/uL (ref 0.7–4.0)
MCH: 26.5 pg (ref 26.0–34.0)
MCHC: 31.5 g/dL (ref 30.0–36.0)
MCV: 84.4 fL (ref 78.0–100.0)
MONO ABS: 0.5 10*3/uL (ref 0.1–1.0)
MONOS PCT: 6 % (ref 3–12)
NEUTROS ABS: 5 10*3/uL (ref 1.7–7.7)
Neutrophils Relative %: 61 % (ref 43–77)
Platelets: 344 10*3/uL (ref 150–400)
RBC: 4.22 MIL/uL (ref 3.87–5.11)
RDW: 17.4 % — AB (ref 11.5–15.5)
WBC: 8.1 10*3/uL (ref 4.0–10.5)

## 2015-02-25 LAB — URINE MICROSCOPIC-ADD ON

## 2015-02-25 LAB — URINALYSIS, ROUTINE W REFLEX MICROSCOPIC
Bilirubin Urine: NEGATIVE
GLUCOSE, UA: NEGATIVE mg/dL
Ketones, ur: NEGATIVE mg/dL
Leukocytes, UA: NEGATIVE
Nitrite: NEGATIVE
Protein, ur: NEGATIVE mg/dL
SPECIFIC GRAVITY, URINE: 1.01 (ref 1.005–1.030)
Urobilinogen, UA: 0.2 mg/dL (ref 0.0–1.0)
pH: 5.5 (ref 5.0–8.0)

## 2015-02-25 LAB — COMPREHENSIVE METABOLIC PANEL
ALBUMIN: 3.5 g/dL (ref 3.5–5.0)
ALK PHOS: 92 U/L (ref 38–126)
ALT: 10 U/L — ABNORMAL LOW (ref 14–54)
ANION GAP: 8 (ref 5–15)
AST: 15 U/L (ref 15–41)
BUN: 36 mg/dL — AB (ref 6–20)
CO2: 27 mmol/L (ref 22–32)
CREATININE: 1.94 mg/dL — AB (ref 0.44–1.00)
Calcium: 9.2 mg/dL (ref 8.9–10.3)
Chloride: 104 mmol/L (ref 101–111)
GFR calc Af Amer: 27 mL/min — ABNORMAL LOW (ref >60)
GFR calc non Af Amer: 23 mL/min — ABNORMAL LOW (ref >60)
Glucose, Bld: 175 mg/dL — ABNORMAL HIGH (ref 65–99)
POTASSIUM: 3.9 mmol/L (ref 3.5–5.1)
Sodium: 139 mmol/L (ref 135–145)
TOTAL PROTEIN: 8.6 g/dL — AB (ref 6.5–8.1)
Total Bilirubin: 0.5 mg/dL (ref 0.3–1.2)

## 2015-02-25 LAB — LIPASE, BLOOD: Lipase: 25 U/L (ref 22–51)

## 2015-02-25 MED ORDER — SODIUM CHLORIDE 0.9 % IV SOLN
INTRAVENOUS | Status: DC
  Administered 2015-02-26 – 2015-02-27 (×3): via INTRAVENOUS
  Administered 2015-02-28: 1 mL via INTRAVENOUS
  Administered 2015-03-01 – 2015-03-02 (×2): via INTRAVENOUS

## 2015-02-25 MED ORDER — HYDROMORPHONE HCL 1 MG/ML IJ SOLN
1.0000 mg | Freq: Once | INTRAMUSCULAR | Status: AC
  Administered 2015-02-25: 1 mg via INTRAVENOUS
  Filled 2015-02-25: qty 1

## 2015-02-25 MED ORDER — INSULIN GLARGINE 100 UNIT/ML ~~LOC~~ SOLN
21.0000 [IU] | Freq: Every day | SUBCUTANEOUS | Status: DC
  Administered 2015-02-26 – 2015-02-28 (×3): 21 [IU] via SUBCUTANEOUS
  Filled 2015-02-25 (×5): qty 0.21

## 2015-02-25 MED ORDER — FERROUS GLUCONATE 324 (38 FE) MG PO TABS
324.0000 mg | ORAL_TABLET | Freq: Every day | ORAL | Status: DC
Start: 2015-02-25 — End: 2015-03-02
  Administered 2015-02-26 – 2015-03-02 (×5): 324 mg via ORAL
  Filled 2015-02-25 (×8): qty 1

## 2015-02-25 MED ORDER — FUROSEMIDE 40 MG PO TABS
40.0000 mg | ORAL_TABLET | Freq: Two times a day (BID) | ORAL | Status: DC
  Administered 2015-02-26: 40 mg via ORAL
  Filled 2015-02-25: qty 1

## 2015-02-25 MED ORDER — ONDANSETRON HCL 4 MG PO TABS
4.0000 mg | ORAL_TABLET | Freq: Four times a day (QID) | ORAL | Status: DC | PRN
  Administered 2015-02-26: 4 mg via ORAL
  Filled 2015-02-25: qty 1

## 2015-02-25 MED ORDER — ONDANSETRON HCL 4 MG/2ML IJ SOLN
4.0000 mg | Freq: Four times a day (QID) | INTRAMUSCULAR | Status: DC | PRN
  Administered 2015-02-28 – 2015-03-02 (×2): 4 mg via INTRAVENOUS
  Filled 2015-02-25 (×4): qty 2

## 2015-02-25 MED ORDER — FUROSEMIDE 40 MG PO TABS: 80.0000 mg | ORAL_TABLET | Freq: Two times a day (BID) | ORAL | Status: DC

## 2015-02-25 MED ORDER — LISINOPRIL 10 MG PO TABS
20.0000 mg | ORAL_TABLET | Freq: Every day | ORAL | Status: DC
  Administered 2015-02-26: 20 mg via ORAL
  Filled 2015-02-25: qty 2

## 2015-02-25 MED ORDER — HEPARIN SODIUM (PORCINE) 5000 UNIT/ML IJ SOLN
5000.0000 [IU] | Freq: Three times a day (TID) | INTRAMUSCULAR | Status: DC
  Administered 2015-02-26 – 2015-03-02 (×13): 5000 [IU] via SUBCUTANEOUS
  Filled 2015-02-25 (×13): qty 1

## 2015-02-25 MED ORDER — DILTIAZEM HCL ER 60 MG PO CP12
120.0000 mg | ORAL_CAPSULE | Freq: Every evening | ORAL | Status: DC
  Administered 2015-02-26 – 2015-03-01 (×5): 120 mg via ORAL
  Filled 2015-02-25 (×7): qty 2

## 2015-02-25 MED ORDER — INSULIN ASPART 100 UNIT/ML ~~LOC~~ SOLN
0.0000 [IU] | Freq: Three times a day (TID) | SUBCUTANEOUS | Status: DC
  Administered 2015-02-26: 5 [IU] via SUBCUTANEOUS
  Administered 2015-02-26 (×2): 3 [IU] via SUBCUTANEOUS
  Administered 2015-02-27 (×2): 5 [IU] via SUBCUTANEOUS
  Administered 2015-02-27: 8 [IU] via SUBCUTANEOUS

## 2015-02-25 MED ORDER — ONDANSETRON HCL 4 MG/2ML IJ SOLN
4.0000 mg | Freq: Once | INTRAMUSCULAR | Status: AC
  Administered 2015-02-25: 4 mg via INTRAVENOUS
  Filled 2015-02-25: qty 2

## 2015-02-25 MED ORDER — INSULIN ASPART 100 UNIT/ML ~~LOC~~ SOLN
0.0000 [IU] | Freq: Every day | SUBCUTANEOUS | Status: DC
  Administered 2015-02-27: 2 [IU] via SUBCUTANEOUS

## 2015-02-25 MED ORDER — GLIPIZIDE 5 MG PO TABS: 5.0000 mg | ORAL_TABLET | Freq: Every day | ORAL | Status: DC

## 2015-02-25 MED ORDER — MORPHINE SULFATE 4 MG/ML IJ SOLN
4.0000 mg | INTRAMUSCULAR | Status: DC | PRN
  Administered 2015-02-26 – 2015-03-02 (×7): 4 mg via INTRAVENOUS
  Filled 2015-02-25 (×9): qty 1

## 2015-02-25 NOTE — H&P (Signed)
[facility] History and Physical  [name] [name] ZJI:[id number] DOB: April 07, 1933 DOA: 02/25/2015  Referring physician: ER PCP: Altamease Oiler, FNP   Chief Complaint: Abdominal pain  HPI: [name] [name] is a 81 y.o. female  This is an 81 year old lady who is had right-sided abdominal pain, mainly in the right upper quadrant, for the last 1 week. This has been associated with nausea and on some occasions vomiting also. The pain is colicky in nature and is quite severe when present. It does not radiate apart from being in the right abdomen. She denies any associated fever. She has not noticed any diarrhea or change in stool color. She is diabetic. She has hypertension. Investigation in the emergency room shows the presence of cholelithiasis without any evidence of cholecystitis. She is now being admitted for further management.   Review of Systems:  Apart from symptoms above, all systems negative.  Past Medical History  Diagnosis Date  . Diabetes mellitus without complication   . Hypertension   . Irregular heart beat   . Obesity 02/25/2015   Past Surgical History  Procedure Laterality Date  . Tubal ligation    . Fracture surgery    . Hip fracture surgery     Social History:  reports that she has never smoked. She does not have any smokeless tobacco history on file. She reports that she does not drink alcohol or use illicit drugs.  No Known Allergies   Family history: She has 3 daughters who all have gallbladder problems.  Prior to Admission medications   Medication Sig Start Date End Date Taking? Authorizing Provider  aspirin EC 81 MG tablet Take 81 mg by mouth daily.   Yes Historical Provider, MD  atorvastatin (LIPITOR) 20 MG tablet Take 20 mg by mouth at bedtime.   Yes Historical Provider, MD  Calcium Carb-Cholecalciferol (CALCIUM 600 + D PO) Take 1 tablet by mouth daily.   Yes Historical Provider, MD  diltiazem (CARDIZEM SR) 120 MG 12 hr capsule Take 120 mg by mouth  every evening.   Yes Historical Provider, MD  ferrous gluconate (FERGON) 324 MG tablet Take 324 mg by mouth daily.   Yes Historical Provider, MD  furosemide (LASIX) 80 MG tablet Take 80 mg by mouth 2 (two) times daily.   Yes Historical Provider, MD  glipiZIDE (GLUCOTROL) 5 MG tablet Take 5 mg by mouth daily.   Yes Historical Provider, MD  LANTUS SOLOSTAR 100 UNIT/ML Solostar Pen Inject 21 Units into the skin daily.  01/12/15  Yes Historical Provider, MD  lisinopril (PRINIVIL,ZESTRIL) 20 MG tablet Take 20 mg by mouth daily.   Yes Historical Provider, MD  Vitamin D, Ergocalciferol, (DRISDOL) 50000 UNITS CAPS capsule Take 50,000 Units by mouth every 30 (thirty) days.   Yes Historical Provider, MD   Physical Exam: Filed Vitals:   02/25/15 1359 02/25/15 1731 02/25/15 2050  BP: 130/64 151/70 159/57  Pulse: 94 85 89  Temp: 99.1 F (37.3 C)    TempSrc: Oral    Resp: Height:  (1.575 m)    Weight: 120.203 kg (265 lb)    SpO2: 100% 100% 98%    Wt Readings from Last 3 Encounters:  02/25/15 120.203 kg (265 lb)    General:  Appears calm and comfortable. She is not toxic or septic. No fever. She does look somewhat clinically dehydrated. Eyes: PERRL, normal lids, irises & conjunctiva ENT: grossly normal hearing, lips & tongue Neck: no LAD, masses or thyromegaly Cardiovascular: RRR,  no m/r/g. No LE edema. Telemetry: SR, no arrhythmias  Respiratory: CTA bilaterally, no w/r/r. Normal respiratory effort. Abdomen: soft, she appears to be tender in the right upper quadrant mainly but also in the right mid and lower quadrants. Bowel sounds are scanty. The abdomen overall is soft and there is no guarding or rigidity. Skin: no rash or induration seen on limited exam Musculoskeletal: grossly normal tone BUE/BLE Psychiatric: grossly normal mood and affect, speech fluent and appropriate Neurologic: grossly non-focal.          Labs on Admission:  Basic Metabolic Panel:  Recent Labs Lab  02/25/15 1417  NA 139  K 3.9  CL 104  CO2 27  GLUCOSE 175*  BUN 36*  CREATININE 1.94*  CALCIUM 9.2   Liver Function Tests:  Recent Labs Lab 02/25/15 1417  AST 15  ALT 10*  ALKPHOS 92  BILITOT 0.5  PROT 8.6*  ALBUMIN 3.5    Recent Labs Lab 02/25/15 1417  LIPASE 25   No results for input(s): AMMONIA in the last 168 hours. CBC:  Recent Labs Lab 02/25/15 1417  WBC 8.1  NEUTROABS 5.0  HGB 11.2*  HCT 35.6*  MCV 84.4  PLT 344   Cardiac Enzymes: No results for input(s): CKTOTAL, CKMB, CKMBINDEX, TROPONINI in the last 168 hours.  BNP (last 3 results) No results for input(s): BNP in the last 8760 hours.  ProBNP (last 3 results) No results for input(s): PROBNP in the last 8760 hours.  CBG: No results for input(s): GLUCAP in the last 168 hours.  Radiological Exams on Admission: Ct Abdomen Pelvis Wo Contrast  02/25/2015   CLINICAL DATA:  81 year old female with right lower quadrant and right flank pain for the past week accompanied by nausea.  EXAM: CT ABDOMEN AND PELVIS WITHOUT CONTRAST  TECHNIQUE: Multidetector CT imaging of the abdomen and pelvis was performed following the standard protocol without IV contrast.  COMPARISON:  Prior right upper quadrant abdominal ultrasound obtained earlier today  FINDINGS: Lower Chest: Nonspecific 4 mm pulmonary nodule in the right lower lobe (image 5 series 3) small foci of tree-in-bud micro nodularity in the periphery of the inferior left lower lobe. Otherwise, the lungs are clear. Visualized cardiac structures are within normal limits for size. Small volume pericardial effusion. Small hiatal hernia.  Abdomen: Unenhanced CT was performed per clinician order. Lack of IV contrast limits sensitivity and specificity, especially for evaluation of abdominal/pelvic solid viscera. Within these limitations, unremarkable CT appearance of the stomach, duodenum, spleen, adrenal glands and pancreas. Normal hepatic contour and morphology. No  discrete hepatic lesion. Small gallstones layer within the gallbladder neck. No secondary evidence of acute cholecystitis.  Unremarkable appearance of the bilateral kidneys. No focal solid lesion, hydronephrosis or nephrolithiasis.  No evidence of obstruction or focal bowel wall thickening. Normal appendix in the right lower quadrant. The terminal ileum is unremarkable. No free fluid or suspicious adenopathy.  Pelvis: Unremarkable bladder, uterus and adnexa. No free fluid or suspicious adenopathy.  Bones/Soft Tissues: No acute fracture or aggressive appearing lytic or blastic osseous lesion. Incompletely imaged left hip arthroplasty prosthesis. Multilevel degenerative disc disease.  Vascular: Atherosclerotic vascular disease without significant stenosis or aneurysmal dilatation.  IMPRESSION: 1. No acute abnormality in the abdomen or pelvis to explain the patient's clinical symptoms. 2. With patchy tree-in-bud micro nodularity in the periphery of the inferior left lower lobe may reflect an acute infectious/inflammatory process, or a chronic indolent atypical infection such as MAI. 3. Nonspecific 4 mm right lower lobe pulmonary  nodule. If the patient is at high risk for bronchogenic carcinoma, follow-up chest CT at 1 year is recommended. If the patient is at low risk, no follow-up is needed. This recommendation follows the consensus statement: Guidelines for Management of Small Pulmonary Nodules Detected on CT Scans: A Statement from the Fleischner Society as published in Radiology 2005; 237:395-400. 4. Cholelithiasis. 5. Scattered atherosclerotic vascular calcifications. 6. Incompletely imaged left hip arthroplasty prosthesis.   Electronically Signed   By: Malachy MoanHeath  McCullough M.D.   On: 02/25/2015 19:21   [place] Abdomen Limited Ruq  02/25/2015   CLINICAL DATA:  RIGHT upper quadrant pain for 1 week, nausea, hypertension, diabetes  EXAM: US ABDOMEN LIMITED - RIGHT UPPER QUADRANT  COMPARISON:  None  FINDINGS: Gallbladder:   Sludge and shadowing calculi in gallbladder, largest defined stone 12 mm diameter. Upper normal gallbladder wall thickness. No pericholecystic fluid or sonographic Murphy sign.  Common bile duct:  Diameter: 3 mm diameter, normal  Liver:  Echogenic, likely fatty infiltration, though this can be seen with cirrhosis and certain infiltrative disorders. No focal hepatic mass or nodularity. Hepatopetal portal venous flow.  No RIGHT upper quadrant ascites.  IMPRESSION: Sludge and calculi within gallbladder without definite evidence of acute cholecystitis or biliary obstruction.  Probable fatty infiltration of liver as above.   Electronically Signed   By: Ulyses SouthwardMark  Boles M.D.   On: 02/25/2015 18:00      Assessment/Plan   1. Right-sided abdominal pain. I think she has been having pain from cholelithiasis and gallstone colic. There is no clinical or radiological indication for cholecystitis. The white blood cell count is not elevated. She may need to HIDA  scan for further evaluation. She will be treated with analgesia and antiemetics. Surgery has been contacted and Dr. Lovell SheehanJenkins will consult on the patient tomorrow. 2. Dehydration. I will give her IV fluids. We will reduce the dose of Lasix. 3. Diabetes. Continue with home medications and sliding scale insulin. 4. Obesity.  Further recommendations will depend on patient's hospital progress.   Code Status: Full code.  DVT Prophylaxis: Heparin.  Family Communication: I discussed the plan with the patient at the bedside.   Disposition Plan: Home when medically stable.   Time spent: 45 minutes.  Wilson SingerGOSRANI,Jestina Stephani C [facility] Pager [phone].

## 2015-02-25 NOTE — ED Notes (Signed)
RLQ pain for 1 week,nausea, no vomiting,  No dysuria, no diarrhea or constipation.

## 2015-02-25 NOTE — ED Provider Notes (Signed)
CSN: [id number]     Arrival date & time 02/25/15  1351 History   First MD Initiated Contact with Patient 02/25/15 1708     Chief Complaint  Patient presents with  . Abdominal Pain     (Consider location/radiation/quality/duration/timing/severity/associated sxs/prior Treatment) Patient is a 81 y.o. female presenting with abdominal pain. The history is provided by the patient (the pt complains of ruq abd pain for 1 week with nauseau).  Abdominal Pain Pain location:  RUQ Pain quality: aching   Pain radiates to:  Does not radiate Pain severity:  Moderate Onset quality:  Gradual Timing:  Constant Progression:  Worsening Chronicity:  New Context: not alcohol use   Associated symptoms: no chest pain, no cough, no diarrhea, no fatigue and no hematuria     Past Medical History  Diagnosis Date  . Diabetes mellitus without complication   . Hypertension   . Irregular heart beat    Past Surgical History  Procedure Laterality Date  . Tubal ligation    . Fracture surgery    . Hip fracture surgery     History reviewed. No pertinent family history. History  Substance Use Topics  . Smoking status: Never Smoker   . Smokeless tobacco: Not on file  . Alcohol Use: No   OB History    No data available     Review of Systems  Constitutional: Negative for appetite change and fatigue.  HENT: Negative for congestion, ear discharge and sinus pressure.   Eyes: Negative for discharge.  Respiratory: Negative for cough.   Cardiovascular: Negative for chest pain.  Gastrointestinal: Positive for abdominal pain. Negative for diarrhea.  Genitourinary: Negative for frequency and hematuria.  Musculoskeletal: Negative for back pain.  Skin: Negative for rash.  Neurological: Negative for seizures and headaches.  Psychiatric/Behavioral: Negative for hallucinations.      Allergies  Review of patient's allergies indicates no known allergies.  Home Medications   Prior to Admission medications    Medication Sig Start Date End Date Taking? Authorizing Provider  aspirin EC 81 MG tablet Take 81 mg by mouth daily.   Yes Historical Provider, MD  atorvastatin (LIPITOR) 20 MG tablet Take 20 mg by mouth at bedtime.   Yes Historical Provider, MD  Calcium Carb-Cholecalciferol (CALCIUM 600 + D PO) Take 1 tablet by mouth daily.   Yes Historical Provider, MD  diltiazem (CARDIZEM SR) 120 MG 12 hr capsule Take 120 mg by mouth every evening.   Yes Historical Provider, MD  ferrous gluconate (FERGON) 324 MG tablet Take 324 mg by mouth daily.   Yes Historical Provider, MD  furosemide (LASIX) 80 MG tablet Take 80 mg by mouth 2 (two) times daily.   Yes Historical Provider, MD  glipiZIDE (GLUCOTROL) 5 MG tablet Take 5 mg by mouth daily.   Yes Historical Provider, MD  LANTUS SOLOSTAR 100 UNIT/ML Solostar Pen Inject 21 Units into the skin daily.  01/12/15  Yes Historical Provider, MD  lisinopril (PRINIVIL,ZESTRIL) 20 MG tablet Take 20 mg by mouth daily.   Yes Historical Provider, MD  Vitamin D, Ergocalciferol, (DRISDOL) 50000 UNITS CAPS capsule Take 50,000 Units by mouth every 30 (thirty) days.   Yes Historical Provider, MD   BP 159/57 mmHg  Pulse 89  Temp(Src) 99.1 F (37.3 C) (Oral)  Resp 17  Ht 5\' 2"  (1.575 m)  Wt 265 lb (120.203 kg)  BMI 48.46 kg/m2  SpO2 98% Physical Exam  Constitutional: She is oriented to person, place, and time. She appears well-developed.  HENT:  Head: Normocephalic.  Eyes: Conjunctivae and EOM are normal. No scleral icterus.  Neck: Neck supple. No thyromegaly present.  Cardiovascular: Normal rate and regular rhythm.  Exam reveals no gallop and no friction rub.   No murmur heard. Pulmonary/Chest: No stridor. She has no wheezes. She has no rales. She exhibits no tenderness.  Abdominal: She exhibits no distension. There is tenderness. There is no rebound.  Moderate ruq tender  Musculoskeletal: Normal range of motion. She exhibits no edema.  Lymphadenopathy:    She has no  cervical adenopathy.  Neurological: She is oriented to person, place, and time. She exhibits normal muscle tone. Coordination normal.  Skin: No rash noted. No erythema.  Psychiatric: She has a normal mood and affect. Her behavior is normal.    ED Course  Procedures (including critical care time) Labs Review Labs Reviewed  CBC WITH DIFFERENTIAL/PLATELET - Abnormal; Notable for the following:    Hemoglobin 11.2 (*)    HCT 35.6 (*)    RDW 17.4 (*)    All other components within normal limits  COMPREHENSIVE METABOLIC PANEL - Abnormal; Notable for the following:    Glucose, Bld 175 (*)    BUN 36 (*)    Creatinine, Ser 1.94 (*)    Total Protein 8.6 (*)    ALT 10 (*)    GFR calc non Af Amer 23 (*)    GFR calc Af Amer 27 (*)    All other components within normal limits  URINALYSIS, ROUTINE W REFLEX MICROSCOPIC - Abnormal; Notable for the following:    Color, Urine STRAW (*)    Hgb urine dipstick SMALL (*)    All other components within normal limits  URINE MICROSCOPIC-ADD ON - Abnormal; Notable for the following:    Squamous Epithelial / LPF MANY (*)    Bacteria, UA MANY (*)    Casts HYALINE CASTS (*)    All other components within normal limits  LIPASE, BLOOD    Imaging Review Ct Abdomen Pelvis Wo Contrast  02/25/2015   CLINICAL DATA:  81 year old female with right lower quadrant and right flank pain for the past week accompanied by nausea.  EXAM: CT ABDOMEN AND PELVIS WITHOUT CONTRAST  TECHNIQUE: Multidetector CT imaging of the abdomen and pelvis was performed following the standard protocol without IV contrast.  COMPARISON:  Prior right upper quadrant abdominal ultrasound obtained earlier today  FINDINGS: Lower Chest: Nonspecific 4 mm pulmonary nodule in the right lower lobe (image 5 series 3) small foci of tree-in-bud micro nodularity in the periphery of the inferior left lower lobe. Otherwise, the lungs are clear. Visualized cardiac structures are within normal limits for size.  Small volume pericardial effusion. Small hiatal hernia.  Abdomen: Unenhanced CT was performed per clinician order. Lack of IV contrast limits sensitivity and specificity, especially for evaluation of abdominal/pelvic solid viscera. Within these limitations, unremarkable CT appearance of the stomach, duodenum, spleen, adrenal glands and pancreas. Normal hepatic contour and morphology. No discrete hepatic lesion. Small gallstones layer within the gallbladder neck. No secondary evidence of acute cholecystitis.  Unremarkable appearance of the bilateral kidneys. No focal solid lesion, hydronephrosis or nephrolithiasis.  No evidence of obstruction or focal bowel wall thickening. Normal appendix in the right lower quadrant. The terminal ileum is unremarkable. No free fluid or suspicious adenopathy.  Pelvis: Unremarkable bladder, uterus and adnexa. No free fluid or suspicious adenopathy.  Bones/Soft Tissues: No acute fracture or aggressive appearing lytic or blastic osseous lesion. Incompletely imaged left hip arthroplasty prosthesis. Multilevel degenerative  disc disease.  Vascular: Atherosclerotic vascular disease without significant stenosis or aneurysmal dilatation.  IMPRESSION: 1. No acute abnormality in the abdomen or pelvis to explain the patient's clinical symptoms. 2. With patchy tree-in-bud micro nodularity in the periphery of the inferior left lower lobe may reflect an acute infectious/inflammatory process, or a chronic indolent atypical infection such as MAI. 3. Nonspecific 4 mm right lower lobe pulmonary nodule. If the patient is at high risk for bronchogenic carcinoma, follow-up chest CT at 1 year is recommended. If the patient is at low risk, no follow-up is needed. This recommendation follows the consensus statement: Guidelines for Management of Small Pulmonary Nodules Detected on CT Scans: A Statement from the Fleischner Society as published in Radiology 2005; 237:395-400. 4. Cholelithiasis. 5. Scattered  atherosclerotic vascular calcifications. 6. Incompletely imaged left hip arthroplasty prosthesis.   Electronically Signed   By: Malachy MoanHeath  McCullough M.D.   On: 02/25/2015 19:21   [place] Abdomen Limited Ruq  02/25/2015   CLINICAL DATA:  RIGHT upper quadrant pain for 1 week, nausea, hypertension, diabetes  EXAM: US ABDOMEN LIMITED - RIGHT UPPER QUADRANT  COMPARISON:  None  FINDINGS: Gallbladder:  Sludge and shadowing calculi in gallbladder, largest defined stone 12 mm diameter. Upper normal gallbladder wall thickness. No pericholecystic fluid or sonographic Murphy sign.  Common bile duct:  Diameter: 3 mm diameter, normal  Liver:  Echogenic, likely fatty infiltration, though this can be seen with cirrhosis and certain infiltrative disorders. No focal hepatic mass or nodularity. Hepatopetal portal venous flow.  No RIGHT upper quadrant ascites.  IMPRESSION: Sludge and calculi within gallbladder without definite evidence of acute cholecystitis or biliary obstruction.  Probable fatty infiltration of liver as above.   Electronically Signed   By: Ulyses SouthwardMark  Boles M.D.   On: 02/25/2015 18:00     EKG Interpretation   Date/Time:  Monday Feb 25 2015 20:56:27 EDT Ventricular Rate:  85 PR Interval:  139 QRS Duration: 87 QT Interval:  361 QTC Calculation: 429 R Axis:   51 Text Interpretation:  Sinus arrhythmia Confirmed by Brenna Friesenhahn  MD, Jomarie LongsJOSEPH  [phone]([id number]) on 02/25/2015 9:05:51 PM      MDM   Final diagnoses:  Pain  Abdominal pain in female     Admit for abd pain,   General surgery to consult in am  Bethann BerkshireJoseph Joyanna Kleman, MD 02/25/15 2106

## 2015-02-26 ENCOUNTER — Encounter (HOSPITAL_COMMUNITY): Payer: Self-pay | Admitting: *Deleted

## 2015-02-26 ENCOUNTER — Inpatient Hospital Stay (HOSPITAL_COMMUNITY): Payer: Medicare HMO

## 2015-02-26 DIAGNOSIS — E1122 Type 2 diabetes mellitus with diabetic chronic kidney disease: Secondary | ICD-10-CM

## 2015-02-26 DIAGNOSIS — N189 Chronic kidney disease, unspecified: Secondary | ICD-10-CM

## 2015-02-26 LAB — GLUCOSE, CAPILLARY
GLUCOSE-CAPILLARY: 193 mg/dL — AB (ref 65–99)
GLUCOSE-CAPILLARY: 192 mg/dL — AB (ref 65–99)
Glucose-Capillary: 115 mg/dL — ABNORMAL HIGH (ref 65–99)
Glucose-Capillary: 199 mg/dL — ABNORMAL HIGH (ref 65–99)
Glucose-Capillary: 203 mg/dL — ABNORMAL HIGH (ref 65–99)

## 2015-02-26 LAB — COMPREHENSIVE METABOLIC PANEL
ALBUMIN: 3.1 g/dL — AB (ref 3.5–5.0)
ALT: 10 U/L — AB (ref 14–54)
AST: 13 U/L — ABNORMAL LOW (ref 15–41)
Alkaline Phosphatase: 83 U/L (ref 38–126)
Anion gap: 7 (ref 5–15)
BUN: 34 mg/dL — AB (ref 6–20)
CALCIUM: 8.9 mg/dL (ref 8.9–10.3)
CO2: 28 mmol/L (ref 22–32)
Chloride: 105 mmol/L (ref 101–111)
Creatinine, Ser: 1.95 mg/dL — ABNORMAL HIGH (ref 0.44–1.00)
GFR calc Af Amer: 27 mL/min — ABNORMAL LOW (ref >60)
GFR calc non Af Amer: 23 mL/min — ABNORMAL LOW (ref >60)
Glucose, Bld: 202 mg/dL — ABNORMAL HIGH (ref 65–99)
Potassium: 4.3 mmol/L (ref 3.5–5.1)
Sodium: 140 mmol/L (ref 135–145)
TOTAL PROTEIN: 7.6 g/dL (ref 6.5–8.1)
Total Bilirubin: 0.5 mg/dL (ref 0.3–1.2)

## 2015-02-26 LAB — CBC
HCT: 34 % — ABNORMAL LOW (ref 36.0–46.0)
Hemoglobin: 10.4 g/dL — ABNORMAL LOW (ref 12.0–15.0)
MCH: 26.1 pg (ref 26.0–34.0)
MCHC: 30.6 g/dL (ref 30.0–36.0)
MCV: 85.4 fL (ref 78.0–100.0)
PLATELETS: 322 10*3/uL (ref 150–400)
RBC: 3.98 MIL/uL (ref 3.87–5.11)
RDW: 17.5 % — AB (ref 11.5–15.5)
WBC: 9.1 10*3/uL (ref 4.0–10.5)

## 2015-02-26 MED ORDER — SINCALIDE 5 MCG IJ SOLR
INTRAMUSCULAR | Status: AC
  Filled 2015-02-26: qty 5

## 2015-02-26 MED ORDER — DEXTROSE 5 % IV SOLN
1.0000 g | INTRAVENOUS | Status: DC
  Administered 2015-02-26 – 2015-02-27 (×2): 1 g via INTRAVENOUS
  Filled 2015-02-26 (×2): qty 10

## 2015-02-26 MED ORDER — STERILE WATER FOR INJECTION IJ SOLN
INTRAMUSCULAR | Status: AC
  Filled 2015-02-26: qty 10

## 2015-02-26 MED ORDER — ISOSORBIDE MONONITRATE ER 60 MG PO TB24
30.0000 mg | ORAL_TABLET | Freq: Every day | ORAL | Status: DC
  Administered 2015-02-26 – 2015-03-02 (×5): 30 mg via ORAL
  Filled 2015-02-26 (×5): qty 1

## 2015-02-26 MED ORDER — SODIUM CHLORIDE 0.9 % IJ SOLN
INTRAMUSCULAR | Status: AC
  Administered 2015-02-26: 10:00:00
  Filled 2015-02-26: qty 48

## 2015-02-26 MED ORDER — DILTIAZEM HCL ER COATED BEADS 120 MG PO CP24
ORAL_CAPSULE | ORAL | Status: AC
  Filled 2015-02-26: qty 1

## 2015-02-26 MED ORDER — TECHNETIUM TC 99M MEBROFENIN IV KIT
5.0000 | PACK | Freq: Once | INTRAVENOUS | Status: AC | PRN
  Administered 2015-02-26: 5 via INTRAVENOUS

## 2015-02-26 NOTE — Consult Note (Signed)
Patient seen, chart reviewed. Full consult to follow. HIDA scan has been ordered.

## 2015-02-26 NOTE — Progress Notes (Signed)
[facility] PROGRESS NOTE  [name] [name] WUJ:[id number] DOB: April 02, 1933 DOA: 02/25/2015 PCP: Altamease Oiler, FNP  Assessment/Plan: 1. Right upper quadrant abdominal pain -Patient undergoing multiple studies during this hospitalization with CT scan and right upper quadrant ultrasound showing presence of cholelithiasis and gallbladder sludge. This was further worked up with a HIDA scan that was unremarkable. -Liver enzymes are negative, white count within normal limits, patient is nontoxic and afebrile. Will await further recommendations from general surgery. -Continue supportive care  2.  Type 2 diabetes mellitus -Patient having blood sugar of 200 this morning, will continue Lantus at 21 units subcutaneous daily, Accu-Cheks ac and hs with sliding scale coverage. -Will continue monitoring blood sugars over the course of the day, titrate insulin accordingly.  3.  Possible acute renal failure -Patient initially presenting with creatinine of 1.94 with BUN of 36. Last set of labs on Epic was from 2009 at which time she had a normal kidney function.  -I spoke with her primary care provider Herbert Deaner FNP over telephone who reported patient's baseline creatinine was about 1.7 -I think for now will continue holding lasix and lisinopril, consider restarting in am if creatinine remains stable.   4.  Hypertension. -Patient's blood pressure slightly elevated having last blood pressure 142/69. As mentioned above ACE inhibitor therapy was held due to possible increase in creatinine, switching her over to imdur. Intended Cardizem 20 mg by mouth daily  5.  Urinary tract infection. -Urinalysis showed the presence of many bacteria -Will cover empirically with ceftriaxone 1 g IV every 24 hours, follow-up on urine cultures  6.  Fluids electrolytes nutrition.  -Patient remains nothing by mouth, will continue maintenance fluids with normal saline at 75 mL/hour   Code Status: Full code Family  Communication: Spoke with her daughters were present at bedside Disposition Plan: Continues to have right upper quadrant abdominal pain, general surgery consulted   Consultants:  General surgery    HPI/Subjective: Patient is a pleasant 81 year old female with a past medical history of diabetes mellitus, hypertension, admitted to the medicine service on 02/25/2015 when she presented with complaints of abdominal pain located in the right upper quadrant that had been present for over a week. Patient workup included a CT scan of abdomen and pelvis without IV contrast that did not show acute intra-abdominal pathology. Cholelithiasis was noted. This was further followed up with a right upper quadrant ultrasound that showed gallbladder sludge and calculi without definite evidence of acute cholecystitis or biliary obstruction. Meanwhile patient continues to have radical quadrant pain, she was seen and evaluated by Dr. Lovell Sheehan of general surgery who recommended HIDA scan. The study showed normal visualization of the liver, bile ducts, gallbladder. Gallbladder ejection fraction 45%.  Objective: Filed Vitals:   02/26/15 0634  BP: 142/69  Pulse: 73  Temp: 98.1 F (36.7 C)  Resp: 16    Intake/Output Summary (Last 24 hours) at 02/26/15 1302 Last data filed at 02/26/15 9562  Gross per 24 hour  Intake    500 ml  Output      0 ml  Net    500 ml   Filed Weights   02/25/15 1359  Weight: 120.203 kg (265 lb)    Exam:   General:  Patient is awake and alert, reports ongoing abdominal pain, however also requesting a diet  Cardiovascular: Regular rate and rhythm normal S1-S2 no extremity edema  Respiratory: Normal respiratory effort, lungs are clear to auscultation  Abdomen: Patient having pain to palpation at mainly located  at the right upper quadrant, there was some guarding no rebound tenderness or peritoneal signs  Musculoskeletal: No edema  Data Reviewed: Basic Metabolic Panel:  Recent  Labs Lab 02/25/15 1417 02/26/15 0627  NA 139 140  K 3.9 4.3  CL 104 105  CO2 27 28  GLUCOSE 175* 202*  BUN 36* 34*  CREATININE 1.94* 1.95*  CALCIUM 9.2 8.9   Liver Function Tests:  Recent Labs Lab 02/25/15 1417 02/26/15 0627  AST 15 13*  ALT 10* 10*  ALKPHOS 92 83  BILITOT 0.5 0.5  PROT 8.6* 7.6  ALBUMIN 3.5 3.1*    Recent Labs Lab 02/25/15 1417  LIPASE 25   No results for input(s): AMMONIA in the last 168 hours. CBC:  Recent Labs Lab 02/25/15 1417 02/26/15 0627  WBC 8.1 9.1  NEUTROABS 5.0  --   HGB 11.2* 10.4*  HCT 35.6* 34.0*  MCV 84.4 85.4  PLT 344 322   Cardiac Enzymes: No results for input(s): CKTOTAL, CKMB, CKMBINDEX, TROPONINI in the last 168 hours. BNP (last 3 results) No results for input(s): BNP in the last 8760 hours.  ProBNP (last 3 results) No results for input(s): PROBNP in the last 8760 hours.  CBG:  Recent Labs Lab 02/26/15 0012 02/26/15 0740 02/26/15 1130  GLUCAP 115* 193* 203*    No results found for this or any previous visit (from the past 240 hour(s)).   Studies: Ct Abdomen Pelvis Wo Contrast  02/25/2015   CLINICAL DATA:  81 year old female with right lower quadrant and right flank pain for the past week accompanied by nausea.  EXAM: CT ABDOMEN AND PELVIS WITHOUT CONTRAST  TECHNIQUE: Multidetector CT imaging of the abdomen and pelvis was performed following the standard protocol without IV contrast.  COMPARISON:  Prior right upper quadrant abdominal ultrasound obtained earlier today  FINDINGS: Lower Chest: Nonspecific 4 mm pulmonary nodule in the right lower lobe (image 5 series 3) small foci of tree-in-bud micro nodularity in the periphery of the inferior left lower lobe. Otherwise, the lungs are clear. Visualized cardiac structures are within normal limits for size. Small volume pericardial effusion. Small hiatal hernia.  Abdomen: Unenhanced CT was performed per clinician order. Lack of IV contrast limits sensitivity and  specificity, especially for evaluation of abdominal/pelvic solid viscera. Within these limitations, unremarkable CT appearance of the stomach, duodenum, spleen, adrenal glands and pancreas. Normal hepatic contour and morphology. No discrete hepatic lesion. Small gallstones layer within the gallbladder neck. No secondary evidence of acute cholecystitis.  Unremarkable appearance of the bilateral kidneys. No focal solid lesion, hydronephrosis or nephrolithiasis.  No evidence of obstruction or focal bowel wall thickening. Normal appendix in the right lower quadrant. The terminal ileum is unremarkable. No free fluid or suspicious adenopathy.  Pelvis: Unremarkable bladder, uterus and adnexa. No free fluid or suspicious adenopathy.  Bones/Soft Tissues: No acute fracture or aggressive appearing lytic or blastic osseous lesion. Incompletely imaged left hip arthroplasty prosthesis. Multilevel degenerative disc disease.  Vascular: Atherosclerotic vascular disease without significant stenosis or aneurysmal dilatation.  IMPRESSION: 1. No acute abnormality in the abdomen or pelvis to explain the patient's clinical symptoms. 2. With patchy tree-in-bud micro nodularity in the periphery of the inferior left lower lobe may reflect an acute infectious/inflammatory process, or a chronic indolent atypical infection such as MAI. 3. Nonspecific 4 mm right lower lobe pulmonary nodule. If the patient is at high risk for bronchogenic carcinoma, follow-up chest CT at 1 year is recommended. If the patient is at low risk, no  follow-up is needed. This recommendation follows the consensus statement: Guidelines for Management of Small Pulmonary Nodules Detected on CT Scans: A Statement from the [employer] as published in Radiology 2005; 237:395-400. 4. Cholelithiasis. 5. Scattered atherosclerotic vascular calcifications. 6. Incompletely imaged left hip arthroplasty prosthesis.   Electronically Signed   By: Malachy MoanHeath  McCullough M.D.   On:  02/25/2015 19:21   Nm Hepato W/eject Fract  02/26/2015   CLINICAL DATA:  Right abdominal pain for the past week.  EXAM: NUCLEAR MEDICINE HEPATOBILIARY IMAGING WITH GALLBLADDER EF  TECHNIQUE: Sequential images of the abdomen were obtained out to 60 minutes following intravenous administration of radiopharmaceutical. After slow intravenous infusion of 2.41 micrograms Cholecystokinin, gallbladder ejection fraction was determined.  RADIOPHARMACEUTICALS:  5 Technetium-1219m Choletec IV  COMPARISON:  The abdomen CT dated 02/25/2015.  FINDINGS: Normal visualization of the liver, bile ducts, gallbladder and small bowel. The gallbladder ejection fraction is 45%. At 45 min, normal ejection fraction is greater than 40%.  IMPRESSION: Normal examination.   Electronically Signed   By: Beckie SaltsSteven  Reid M.D.   On: 02/26/2015 10:36   [place] Abdomen Limited Ruq  02/25/2015   CLINICAL DATA:  RIGHT upper quadrant pain for 1 week, nausea, hypertension, diabetes  EXAM: US ABDOMEN LIMITED - RIGHT UPPER QUADRANT  COMPARISON:  None  FINDINGS: Gallbladder:  Sludge and shadowing calculi in gallbladder, largest defined stone 12 mm diameter. Upper normal gallbladder wall thickness. No pericholecystic fluid or sonographic Murphy sign.  Common bile duct:  Diameter: 3 mm diameter, normal  Liver:  Echogenic, likely fatty infiltration, though this can be seen with cirrhosis and certain infiltrative disorders. No focal hepatic mass or nodularity. Hepatopetal portal venous flow.  No RIGHT upper quadrant ascites.  IMPRESSION: Sludge and calculi within gallbladder without definite evidence of acute cholecystitis or biliary obstruction.  Probable fatty infiltration of liver as above.   Electronically Signed   By: Ulyses SouthwardMark  Boles M.D.   On: 02/25/2015 18:00    Scheduled Meds: . diltiazem  120 mg Oral QPM  . ferrous gluconate  324 mg Oral Daily  . heparin  5,000 Units Subcutaneous 3 times per day  . insulin aspart  0-15 Units Subcutaneous TID WC  . insulin  aspart  0-5 Units Subcutaneous QHS  . insulin glargine  21 Units Subcutaneous Daily  . isosorbide mononitrate  30 mg Oral Daily  . sincalide      . sodium chloride      . sterile water (preservative free)       Continuous Infusions: . sodium chloride 100 mL/hr at 02/26/15 0140    Active Problems:   Abdominal pain   Cholelithiasis   Obesity   Diabetes    Time spent: 35 minutes    Jeralyn BennettZAMORA, Kaylinn Dedic  [facility] Pager [phone]. If 7PM-7AM, please contact night-coverage at [email or web address], password [facility] [email or web address] 02/26/2015, 1:02 PM  LOS: 1 day

## 2015-02-26 NOTE — Progress Notes (Signed)
UR chart review completed.  

## 2015-02-26 NOTE — Consult Note (Signed)
Reason for Consult: Right upper quadrant abdominal pain Referring Physician: [facility]  [name] [name] is an 81 y.o. female.  HPI: Patient is an 81 year old black female who presented to the emergency room with worsening right upper quadrant abdominal pain which have been occurring over the past few days. She denied any fever, chills, jaundice, diarrhea, or constipation. Ultrasound the gallbladder revealed sludge and cholelithiasis without overt evidence of cholecystitis. The hiatus scan was performed the same afternoon which revealed a 45% injection fraction and no reproducible symptoms with CCK injection. The patient just ate a regular diet and states she feels much better. She describes that her abdominal pain has eased significantly and she is just sore.  Past Medical History  Diagnosis Date  . Diabetes mellitus without complication   . Hypertension   . Irregular heart beat   . Obesity 02/25/2015    Past Surgical History  Procedure Laterality Date  . Tubal ligation    . Fracture surgery    . Hip fracture surgery      History reviewed. No pertinent family history.  Social History:  reports that she has never smoked. She does not have any smokeless tobacco history on file. She reports that she does not drink alcohol or use illicit drugs.  Allergies: No Known Allergies  Medications: I have reviewed the patient's current medications.  Results for orders placed or performed during the hospital encounter of 02/25/15 (from the past 48 hour(s))  CBC with Differential     Status: Abnormal   Collection Time: 02/25/15  2:17 PM  Result Value Ref Range   WBC 8.1 4.0 - 10.5 K/uL   RBC 4.22 3.87 - 5.11 MIL/uL   Hemoglobin 11.2 (L) 12.0 - 15.0 g/dL   HCT 35.6 (L) 36.0 - 46.0 %   MCV 84.4 78.0 - 100.0 fL   MCH 26.5 26.0 - 34.0 pg   MCHC 31.5 30.0 - 36.0 g/dL   RDW 17.4 (H) 11.5 - 15.5 %   Platelets 344 150 - 400 K/uL   Neutrophils Relative % 61 43 - 77 %   Neutro Abs 5.0 1.7 -  7.7 K/uL   Lymphocytes Relative 31 12 - 46 %   Lymphs Abs 2.5 0.7 - 4.0 K/uL   Monocytes Relative 6 3 - 12 %   Monocytes Absolute 0.5 0.1 - 1.0 K/uL   Eosinophils Relative 2 0 - 5 %   Eosinophils Absolute 0.1 0.0 - 0.7 K/uL   Basophils Relative 0 0 - 1 %   Basophils Absolute 0.0 0.0 - 0.1 K/uL  Comprehensive metabolic panel     Status: Abnormal   Collection Time: 02/25/15  2:17 PM  Result Value Ref Range   Sodium 139 135 - 145 mmol/L   Potassium 3.9 3.5 - 5.1 mmol/L   Chloride 104 101 - 111 mmol/L   CO2 27 22 - 32 mmol/L   Glucose, Bld 175 (H) 65 - 99 mg/dL   BUN 36 (H) 6 - 20 mg/dL   Creatinine, Ser 1.94 (H) 0.44 - 1.00 mg/dL   Calcium 9.2 8.9 - 10.3 mg/dL   Total Protein 8.6 (H) 6.5 - 8.1 g/dL   Albumin 3.5 3.5 - 5.0 g/dL   AST 15 15 - 41 U/L   ALT 10 (L) 14 - 54 U/L   Alkaline Phosphatase 92 38 - 126 U/L   Total Bilirubin 0.5 0.3 - 1.2 mg/dL   GFR calc non Af Amer 23 (L) >60 mL/min   GFR calc  Af Amer 27 (L) >60 mL/min    Comment: (NOTE) The eGFR has been calculated using the CKD EPI equation. This calculation has not been validated in all clinical situations. eGFR's persistently <60 mL/min signify possible Chronic Kidney Disease.    Anion gap 8 5 - 15  Lipase, blood     Status: None   Collection Time: 02/25/15  2:17 PM  Result Value Ref Range   Lipase 25 22 - 51 U/L  Urinalysis, Routine w reflex microscopic     Status: Abnormal   Collection Time: 02/25/15  6:43 PM  Result Value Ref Range   Color, Urine STRAW (A) YELLOW   APPearance CLEAR CLEAR   Specific Gravity, Urine 1.010 1.005 - 1.030   pH 5.5 5.0 - 8.0   Glucose, UA NEGATIVE NEGATIVE mg/dL   Hgb urine dipstick SMALL (A) NEGATIVE   Bilirubin Urine NEGATIVE NEGATIVE   Ketones, ur NEGATIVE NEGATIVE mg/dL   Protein, ur NEGATIVE NEGATIVE mg/dL   Urobilinogen, UA 0.2 0.0 - 1.0 mg/dL   Nitrite NEGATIVE NEGATIVE   Leukocytes, UA NEGATIVE NEGATIVE  Urine microscopic-add on     Status: Abnormal   Collection Time:  02/25/15  6:43 PM  Result Value Ref Range   Squamous Epithelial / LPF MANY (A) RARE   WBC, UA 0-2 <3 WBC/hpf   RBC / HPF 0-2 <3 RBC/hpf   Bacteria, UA MANY (A) RARE   Casts HYALINE CASTS (A) NEGATIVE  Glucose, capillary     Status: Abnormal   Collection Time: 02/26/15 12:12 AM  Result Value Ref Range   Glucose-Capillary 115 (H) 65 - 99 mg/dL  Comprehensive metabolic panel     Status: Abnormal   Collection Time: 02/26/15  6:27 AM  Result Value Ref Range   Sodium 140 135 - 145 mmol/L   Potassium 4.3 3.5 - 5.1 mmol/L   Chloride 105 101 - 111 mmol/L   CO2 28 22 - 32 mmol/L   Glucose, Bld 202 (H) 65 - 99 mg/dL   BUN 34 (H) 6 - 20 mg/dL   Creatinine, Ser 1.95 (H) 0.44 - 1.00 mg/dL   Calcium 8.9 8.9 - 10.3 mg/dL   Total Protein 7.6 6.5 - 8.1 g/dL   Albumin 3.1 (L) 3.5 - 5.0 g/dL   AST 13 (L) 15 - 41 U/L   ALT 10 (L) 14 - 54 U/L   Alkaline Phosphatase 83 38 - 126 U/L   Total Bilirubin 0.5 0.3 - 1.2 mg/dL   GFR calc non Af Amer 23 (L) >60 mL/min   GFR calc Af Amer 27 (L) >60 mL/min    Comment: (NOTE) The eGFR has been calculated using the CKD EPI equation. This calculation has not been validated in all clinical situations. eGFR's persistently <60 mL/min signify possible Chronic Kidney Disease.    Anion gap 7 5 - 15  CBC     Status: Abnormal   Collection Time: 02/26/15  6:27 AM  Result Value Ref Range   WBC 9.1 4.0 - 10.5 K/uL   RBC 3.98 3.87 - 5.11 MIL/uL   Hemoglobin 10.4 (L) 12.0 - 15.0 g/dL   HCT 34.0 (L) 36.0 - 46.0 %   MCV 85.4 78.0 - 100.0 fL   MCH 26.1 26.0 - 34.0 pg   MCHC 30.6 30.0 - 36.0 g/dL   RDW 17.5 (H) 11.5 - 15.5 %   Platelets 322 150 - 400 K/uL  Glucose, capillary     Status: Abnormal   Collection Time: 02/26/15  7:40 AM  Result Value Ref Range   Glucose-Capillary 193 (H) 65 - 99 mg/dL  Glucose, capillary     Status: Abnormal   Collection Time: 02/26/15 11:30 AM  Result Value Ref Range   Glucose-Capillary 203 (H) 65 - 99 mg/dL    Ct Abdomen Pelvis Wo  Contrast  02/25/2015   CLINICAL DATA:  81 year old female with right lower quadrant and right flank pain for the past week accompanied by nausea.  EXAM: CT ABDOMEN AND PELVIS WITHOUT CONTRAST  TECHNIQUE: Multidetector CT imaging of the abdomen and pelvis was performed following the standard protocol without IV contrast.  COMPARISON:  Prior right upper quadrant abdominal ultrasound obtained earlier today  FINDINGS: Lower Chest: Nonspecific 4 mm pulmonary nodule in the right lower lobe (image 5 series 3) small foci of tree-in-bud micro nodularity in the periphery of the inferior left lower lobe. Otherwise, the lungs are clear. Visualized cardiac structures are within normal limits for size. Small volume pericardial effusion. Small hiatal hernia.  Abdomen: Unenhanced CT was performed per clinician order. Lack of IV contrast limits sensitivity and specificity, especially for evaluation of abdominal/pelvic solid viscera. Within these limitations, unremarkable CT appearance of the stomach, duodenum, spleen, adrenal glands and pancreas. Normal hepatic contour and morphology. No discrete hepatic lesion. Small gallstones layer within the gallbladder neck. No secondary evidence of acute cholecystitis.  Unremarkable appearance of the bilateral kidneys. No focal solid lesion, hydronephrosis or nephrolithiasis.  No evidence of obstruction or focal bowel wall thickening. Normal appendix in the right lower quadrant. The terminal ileum is unremarkable. No free fluid or suspicious adenopathy.  Pelvis: Unremarkable bladder, uterus and adnexa. No free fluid or suspicious adenopathy.  Bones/Soft Tissues: No acute fracture or aggressive appearing lytic or blastic osseous lesion. Incompletely imaged left hip arthroplasty prosthesis. Multilevel degenerative disc disease.  Vascular: Atherosclerotic vascular disease without significant stenosis or aneurysmal dilatation.  IMPRESSION: 1. No acute abnormality in the abdomen or pelvis to  explain the patient's clinical symptoms. 2. With patchy tree-in-bud micro nodularity in the periphery of the inferior left lower lobe may reflect an acute infectious/inflammatory process, or a chronic indolent atypical infection such as MAI. 3. Nonspecific 4 mm right lower lobe pulmonary nodule. If the patient is at high risk for bronchogenic carcinoma, follow-up chest CT at 1 year is recommended. If the patient is at low risk, no follow-up is needed. This recommendation follows the consensus statement: Guidelines for Management of Small Pulmonary Nodules Detected on CT Scans: A Statement from the [employer] as published in Radiology 2005; 237:395-400. 4. Cholelithiasis. 5. Scattered atherosclerotic vascular calcifications. 6. Incompletely imaged left hip arthroplasty prosthesis.   Electronically Signed   By: Jacqulynn Cadet M.D.   On: 02/25/2015 19:21   Nm Hepato W/eject Fract  02/26/2015   CLINICAL DATA:  Right abdominal pain for the past week.  EXAM: NUCLEAR MEDICINE HEPATOBILIARY IMAGING WITH GALLBLADDER EF  TECHNIQUE: Sequential images of the abdomen were obtained out to 60 minutes following intravenous administration of radiopharmaceutical. After slow intravenous infusion of 2.41 micrograms Cholecystokinin, gallbladder ejection fraction was determined.  RADIOPHARMACEUTICALS:  5 Technetium-81mCholetec IV  COMPARISON:  The abdomen CT dated 02/25/2015.  FINDINGS: Normal visualization of the liver, bile ducts, gallbladder and small bowel. The gallbladder ejection fraction is 45%. At 45 min, normal ejection fraction is greater than 40%.  IMPRESSION: Normal examination.   Electronically Signed   By: SClaudie ReveringM.D.   On: 02/26/2015 10:36   [place]domen Limited Ruq  02/25/2015  CLINICAL DATA:  RIGHT upper quadrant pain for 1 week, nausea, hypertension, diabetes  EXAM: US ABDOMEN LIMITED - RIGHT UPPER QUADRANT  COMPARISON:  None  FINDINGS: Gallbladder:  Sludge and shadowing calculi in gallbladder,  largest defined stone 12 mm diameter. Upper normal gallbladder wall thickness. No pericholecystic fluid or sonographic Murphy sign.  Common bile duct:  Diameter: 3 mm diameter, normal  Liver:  Echogenic, likely fatty infiltration, though this can be seen with cirrhosis and certain infiltrative disorders. No focal hepatic mass or nodularity. Hepatopetal portal venous flow.  No RIGHT upper quadrant ascites.  IMPRESSION: Sludge and calculi within gallbladder without definite evidence of acute cholecystitis or biliary obstruction.  Probable fatty infiltration of liver as above.   Electronically Signed   By: Lavonia Dana M.D.   On: 02/25/2015 18:00    ROS: See chart Blood pressure 126/59, pulse 82, temperature 98.5 F (36.9 C), temperature source Oral, resp. rate 18, height _0  (1.575 m), weight 120.203 kg (265 lb), SpO2 94 %. Physical Exam: Morbidly obese black female in no acute distress. Abdomen soft with minimal tenderness to the slight touch in the right upper quadrant. This has improved since I last saw her earlier today. She has no hepatosplenomegaly, masses, or hernias, though examination is limited secondary to body habitus. No rigidity is noted.  Assessment/Plan: Impression: Right upper quadrant abdominal pain of unknown etiology, resolving. I doubt she has biliary colic or cholecystitis at this time. Plan: If patient is able to tolerate a diet in the morning, she is okay for discharge from the surgery standpoint. I do not recommend a laparoscopic cholecystectomy at this time. Thank you for consulting me on this patient.  Sierra Spargo A 02/26/2015, 5:46 PM

## 2015-02-26 NOTE — Care Management Note (Signed)
Case Management Note  Patient Details  Name: [name] [name] MRN: [id number] Date of Birth: 06-18-1933  Subjective/Objective:                    Action/Plan:   Expected Discharge Date:  02/28/15               Expected Discharge Plan:  Home w Home Health Services  In-House Referral:  NA  Discharge planning Services  CM Consult  Post Acute Care Choice:    Choice offered to:     DME Arranged:    DME Agency:     HH Arranged:    HH Agency:     Status of Service:  In process, will continue to follow  Medicare Important Message Given:    Date Medicare IM Given:    Medicare IM give by:    Date Additional Medicare IM Given:    Additional Medicare Important Message give by:     If discussed at Long Length of Stay Meetings, dates discussed:    Additional Comments: Per 2nd level review pt is OBS. Attending agreed and order changed. CC 44 given. Arlyss QueenBlackwell, Kaira Stringfield Hoonahrowder, RN 02/26/2015, 4:18 PM

## 2015-02-26 NOTE — Care Management Note (Signed)
Case Management Note  Patient Details  Name: [name] [name] MRN: [id number] Date of Birth: [birth year]  Subjective/Objective:                  Pt admitted from home with abd pain. Pt lives with her daughter and will return home at discharge. Pt has a walker for home use. Pt requires some assistance with ADl's.  Action/Plan: ? Need for home health at discharge. Did explain CAP program to daughter and how to get this service started.  Expected Discharge Date:  02/28/15               Expected Discharge Plan:  Home w Home Health Services  In-House Referral:  NA  Discharge planning Services  CM Consult  Post Acute Care Choice:    Choice offered to:     DME Arranged:    DME Agency:     HH Arranged:    HH Agency:     Status of Service:  In process, will continue to follow  Medicare Important Message Given:    Date Medicare IM Given:    Medicare IM give by:    Date Additional Medicare IM Given:    Additional Medicare Important Message give by:     If discussed at Long Length of Stay Meetings, dates discussed:    Additional Comments:  Cheryl FlashBlackwell, Adriane Guglielmo Crowder, RN 02/26/2015, 2:09 PM

## 2015-02-27 ENCOUNTER — Observation Stay (HOSPITAL_COMMUNITY): Payer: Medicare HMO

## 2015-02-27 DIAGNOSIS — K8010 Calculus of gallbladder with chronic cholecystitis without obstruction: Secondary | ICD-10-CM

## 2015-02-27 DIAGNOSIS — N179 Acute kidney failure, unspecified: Secondary | ICD-10-CM

## 2015-02-27 LAB — BASIC METABOLIC PANEL
ANION GAP: 7 (ref 5–15)
Anion gap: 7 (ref 5–15)
BUN: 43 mg/dL — ABNORMAL HIGH (ref 6–20)
BUN: 46 mg/dL — ABNORMAL HIGH (ref 6–20)
CALCIUM: 8.5 mg/dL — AB (ref 8.9–10.3)
CALCIUM: 8.3 mg/dL — AB (ref 8.9–10.3)
CHLORIDE: 103 mmol/L (ref 101–111)
CO2: 26 mmol/L (ref 22–32)
CO2: 24 mmol/L (ref 22–32)
CREATININE: 3.61 mg/dL — AB (ref 0.44–1.00)
Chloride: 101 mmol/L (ref 101–111)
Creatinine, Ser: 3.85 mg/dL — ABNORMAL HIGH (ref 0.44–1.00)
GFR, EST AFRICAN AMERICAN: 13 mL/min — AB (ref >60)
GFR, EST AFRICAN AMERICAN: 12 mL/min — AB (ref >60)
GFR, EST NON AFRICAN AMERICAN: 11 mL/min — AB (ref >60)
GFR, EST NON AFRICAN AMERICAN: 10 mL/min — AB (ref >60)
Glucose, Bld: 279 mg/dL — ABNORMAL HIGH (ref 65–99)
Glucose, Bld: 293 mg/dL — ABNORMAL HIGH (ref 65–99)
POTASSIUM: 4.4 mmol/L (ref 3.5–5.1)
Potassium: 4.3 mmol/L (ref 3.5–5.1)
SODIUM: 132 mmol/L — AB (ref 135–145)
Sodium: 136 mmol/L (ref 135–145)

## 2015-02-27 LAB — GLUCOSE, CAPILLARY
GLUCOSE-CAPILLARY: 255 mg/dL — AB (ref 65–99)
Glucose-Capillary: 248 mg/dL — ABNORMAL HIGH (ref 65–99)
Glucose-Capillary: 240 mg/dL — ABNORMAL HIGH (ref 65–99)
Glucose-Capillary: 232 mg/dL — ABNORMAL HIGH (ref 65–99)

## 2015-02-27 LAB — CBC
HCT: 33.4 % — ABNORMAL LOW (ref 36.0–46.0)
Hemoglobin: 10.2 g/dL — ABNORMAL LOW (ref 12.0–15.0)
MCH: 26.2 pg (ref 26.0–34.0)
MCHC: 30.5 g/dL (ref 30.0–36.0)
MCV: 85.9 fL (ref 78.0–100.0)
Platelets: 302 10*3/uL (ref 150–400)
RBC: 3.89 MIL/uL (ref 3.87–5.11)
RDW: 17.6 % — AB (ref 11.5–15.5)
WBC: 8.6 10*3/uL (ref 4.0–10.5)

## 2015-02-27 MED ORDER — INSULIN ASPART 100 UNIT/ML ~~LOC~~ SOLN
6.0000 [IU] | Freq: Three times a day (TID) | SUBCUTANEOUS | Status: DC
  Administered 2015-02-28 – 2015-03-02 (×4): 6 [IU] via SUBCUTANEOUS

## 2015-02-27 MED ORDER — INSULIN ASPART 100 UNIT/ML ~~LOC~~ SOLN
0.0000 [IU] | Freq: Three times a day (TID) | SUBCUTANEOUS | Status: DC
  Administered 2015-02-28: 3 [IU] via SUBCUTANEOUS
  Administered 2015-02-28 (×2): 7 [IU] via SUBCUTANEOUS
  Administered 2015-03-01: 3 [IU] via SUBCUTANEOUS
  Administered 2015-03-01: 4 [IU] via SUBCUTANEOUS
  Administered 2015-03-02: 3 [IU] via SUBCUTANEOUS

## 2015-02-27 NOTE — Progress Notes (Signed)
[facility] PROGRESS NOTE  [name] [name] ZOX:[id number] DOB: Feb 02, 1933 DOA: 02/25/2015 PCP: Altamease OilerHARRIS,MEREDITH L, FNP  Assessment/Plan: Right upper quadrant/right flank pain -Etiology remains unclear, she is improved today. -Right upper quadrant ultrasound did show presence of cholelithiasis and sludge, HIDA scan was unremarkable. Was seen in consultation by surgery, Dr. Lovell SheehanJenkins, who did not believe that patient had acute cholecystitis. I agree. -Etiology remains unclear at this time.  Acute renal failure -Creatinine has risen dramatically overnight from 1.9-3.6. Repeat stat read confirms this value. -Not on nephrotoxins. -We will continue IV fluids, check renal ultrasound. -If continues to worsen over a month, will consider nephrology consultation in the morning.  Questionable UTI -UA appears negative. -We'll discontinue Rocephin at this point.  Diabetes mellitus -Uncontrolled.  -Increase sliding scale to resistant given her obesity and add mealtime coverage. -Check A1c.  Hypertension -Well-controlled.  Code Status: Full code Family Communication: Patient only  Disposition Plan: Home when ready, anticipate 24-48 hours.   Consultants:  None   Antibiotics:  None   Subjective: Complains of some mild right flank pain today, no nausea or vomiting.  Objective: Filed Vitals:   02/26/15 1345 02/26/15 2045 02/27/15 0537 02/27/15 1533  BP: 126/59 106/68 130/64 140/53  Pulse: 82 81 66 73  Temp: 98.5 F (36.9 C) 97.6 F (36.4 C) 98.5 F (36.9 C) 98.4 F (36.9 C)  TempSrc: Oral Oral Oral Oral  Resp: 18 20 20 20   Height:      Weight:      SpO2: 94% 94% 98% 95%    Intake/Output Summary (Last 24 hours) at 02/27/15 1819 Last data filed at 02/27/15 1300  Gross per 24 hour  Intake 2522.08 ml  Output      0 ml  Net 2522.08 ml   Filed Weights   02/25/15 1359  Weight: 120.203 kg (265 lb)    Exam:   General:  Alert, awake, oriented 3, no  distress  Cardiovascular: Regular rate and rhythm, no murmurs, rubs or gallops  Respiratory: Clear to auscultation bilaterally  Abdomen: Obese, soft, nondistended, positive bowel sounds, tender to palpation to the right flank  Extremities: 1+ pitting edema bilaterally   Neurologic:  Grossly intact and nonfocal  Data Reviewed: Basic Metabolic Panel:  Recent Labs Lab 02/25/15 1417 02/26/15 0627 02/27/15 0624 02/27/15 1320  NA 139 140 136 132*  K 3.9 4.3 4.3 4.4  CL 104 105 103 101  CO2 27 28 26 24   GLUCOSE 175* 202* 279* 293*  BUN 36* 34* 43* 46*  CREATININE 1.94* 1.95* 3.61* 3.85*  CALCIUM 9.2 8.9 8.5* 8.3*   Liver Function Tests:  Recent Labs Lab 02/25/15 1417 02/26/15 0627  AST 15 13*  ALT 10* 10*  ALKPHOS 92 83  BILITOT 0.5 0.5  PROT 8.6* 7.6  ALBUMIN 3.5 3.1*    Recent Labs Lab 02/25/15 1417  LIPASE 25   No results for input(s): AMMONIA in the last 168 hours. CBC:  Recent Labs Lab 02/25/15 1417 02/26/15 0627 02/27/15 0624  WBC 8.1 9.1 8.6  NEUTROABS 5.0  --   --   HGB 11.2* 10.4* 10.2*  HCT 35.6* 34.0* 33.4*  MCV 84.4 85.4 85.9  PLT 344 322 302   Cardiac Enzymes: No results for input(s): CKTOTAL, CKMB, CKMBINDEX, TROPONINI in the last 168 hours. BNP (last 3 results) No results for input(s): BNP in the last 8760 hours.  ProBNP (last 3 results) No results for input(s): PROBNP in the last 8760 hours.  CBG:  Recent Labs Lab 02/26/15 1758 02/26/15 2057 02/27/15 0747 02/27/15 1117 02/27/15 1625  GLUCAP 199* 192* 255* 248* 240*    No results found for this or any previous visit (from the past 240 hour(s)).   Studies: Ct Abdomen Pelvis Wo Contrast  02/25/2015   CLINICAL DATA:  81 year old female with right lower quadrant and right flank pain for the past week accompanied by nausea.  EXAM: CT ABDOMEN AND PELVIS WITHOUT CONTRAST  TECHNIQUE: Multidetector CT imaging of the abdomen and pelvis was performed following the standard protocol  without IV contrast.  COMPARISON:  Prior right upper quadrant abdominal ultrasound obtained earlier today  FINDINGS: Lower Chest: Nonspecific 4 mm pulmonary nodule in the right lower lobe (image 5 series 3) small foci of tree-in-bud micro nodularity in the periphery of the inferior left lower lobe. Otherwise, the lungs are clear. Visualized cardiac structures are within normal limits for size. Small volume pericardial effusion. Small hiatal hernia.  Abdomen: Unenhanced CT was performed per clinician order. Lack of IV contrast limits sensitivity and specificity, especially for evaluation of abdominal/pelvic solid viscera. Within these limitations, unremarkable CT appearance of the stomach, duodenum, spleen, adrenal glands and pancreas. Normal hepatic contour and morphology. No discrete hepatic lesion. Small gallstones layer within the gallbladder neck. No secondary evidence of acute cholecystitis.  Unremarkable appearance of the bilateral kidneys. No focal solid lesion, hydronephrosis or nephrolithiasis.  No evidence of obstruction or focal bowel wall thickening. Normal appendix in the right lower quadrant. The terminal ileum is unremarkable. No free fluid or suspicious adenopathy.  Pelvis: Unremarkable bladder, uterus and adnexa. No free fluid or suspicious adenopathy.  Bones/Soft Tissues: No acute fracture or aggressive appearing lytic or blastic osseous lesion. Incompletely imaged left hip arthroplasty prosthesis. Multilevel degenerative disc disease.  Vascular: Atherosclerotic vascular disease without significant stenosis or aneurysmal dilatation.  IMPRESSION: 1. No acute abnormality in the abdomen or pelvis to explain the patient's clinical symptoms. 2. With patchy tree-in-bud micro nodularity in the periphery of the inferior left lower lobe may reflect an acute infectious/inflammatory process, or a chronic indolent atypical infection such as MAI. 3. Nonspecific 4 mm right lower lobe pulmonary nodule. If the  patient is at high risk for bronchogenic carcinoma, follow-up chest CT at 1 year is recommended. If the patient is at low risk, no follow-up is needed. This recommendation follows the consensus statement: Guidelines for Management of Small Pulmonary Nodules Detected on CT Scans: A Statement from the Fleischner Society as published in Radiology 2005; 237:395-400. 4. Cholelithiasis. 5. Scattered atherosclerotic vascular calcifications. 6. Incompletely imaged left hip arthroplasty prosthesis.   Electronically Signed   By: Malachy MoanHeath  McCullough M.D.   On: 02/25/2015 19:21   [place] Renal  02/27/2015   CLINICAL DATA:  Acute renal failure.  EXAM: RENAL / URINARY TRACT ULTRASOUND COMPLETE  COMPARISON:  CT scan of Feb 25, 2015.  FINDINGS: Right Kidney:  Length: 11.0 cm. Echogenicity within normal limits. No mass or hydronephrosis visualized.  Left Kidney:  Length: 10.9 cm. Echogenicity within normal limits. No mass or hydronephrosis visualized.  Bladder:  Not well visualized due to lack of distension.  IMPRESSION: Normal renal ultrasound.   Electronically Signed   By: Lupita RaiderJames  Green Jr, M.D.   On: 02/27/2015 16:16   Nm Hepato W/eject Fract  02/26/2015   CLINICAL DATA:  Right abdominal pain for the past week.  EXAM: NUCLEAR MEDICINE HEPATOBILIARY IMAGING WITH GALLBLADDER EF  TECHNIQUE: Sequential images of the abdomen were obtained out to 60 minutes following intravenous  administration of radiopharmaceutical. After slow intravenous infusion of 2.41 micrograms Cholecystokinin, gallbladder ejection fraction was determined.  RADIOPHARMACEUTICALS:  5 Technetium-58m Choletec IV  COMPARISON:  The abdomen CT dated 02/25/2015.  FINDINGS: Normal visualization of the liver, bile ducts, gallbladder and small bowel. The gallbladder ejection fraction is 45%. At 45 min, normal ejection fraction is greater than 40%.  IMPRESSION: Normal examination.   Electronically Signed   By: Beckie Salts M.D.   On: 02/26/2015 10:36    Scheduled Meds: .  cefTRIAXone (ROCEPHIN)  IV  1 g Intravenous Q24H  . diltiazem  120 mg Oral QPM  . ferrous gluconate  324 mg Oral Daily  . heparin  5,000 Units Subcutaneous 3 times per day  . insulin aspart  0-15 Units Subcutaneous TID WC  . insulin aspart  0-5 Units Subcutaneous QHS  . insulin glargine  21 Units Subcutaneous Daily  . isosorbide mononitrate  30 mg Oral Daily   Continuous Infusions: . sodium chloride 75 mL/hr at 02/27/15 1046    Active Problems:   Abdominal pain   Cholelithiasis   Obesity   Diabetes    Time spent: 30 minutes. Greater than 50% of this time was spent in direct contact with the patient coordinating care.    Chaya Jan  [facility] Pager [phone]  If 7PM-7AM, please contact night-coverage at [email or web address], password [facility] 02/27/2015, 6:19 PM  LOS: 2 days

## 2015-02-27 NOTE — Progress Notes (Signed)
Inpatient Diabetes Program Recommendations  AACE/ADA: New Consensus Statement on Inpatient Glycemic Control (2013)  Target Ranges:  Prepandial:   less than 140 mg/dL      Peak postprandial:   less than 180 mg/dL (1-2 hours)      Critically ill patients:  140 - 180 mg/dL   Results for Bracey, Jarrah R (MRN [id number]) as of 02/27/2015 11:35  Ref. Range 02/26/2015 00:12 02/26/2015 07:40 02/26/2015 11:30 02/26/2015 17:58 02/26/2015 20:57 02/27/2015 07:47 02/27/2015 11:17  Glucose-Capillary Latest Ref Range: 65-99 mg/dL 409115 (H) 811193 (H) 914203 (H) 199 (H) 192 (H) 255 (H) 248 (H)   Diabetes history: DM2 Outpatient Diabetes medications: Lantus 21 units daily, Glipizide 5 mg daily Current orders for Inpatient glycemic control: Lantus 21 units daily, Novolog 0-15 units TID with meals, Novolog 0-5 units HS  Inpatient Diabetes Program Recommendations Insulin - Basal: Please consider increasing Lantus to 24 units daily (based on 120 kg x 0.2 units). Insulin - Meal Coverage: Please consider ordering Novolog 3 units TID with meals for meal coverage (in addition to Novolog correction scale). HgbA1C: Please consider adding an A1C to blood in lab to evaluate glycemic control over the past 2-3 months.  Thanks, Orlando PennerMarie Silvio Sausedo, RN, MSN, CCRN, CDE Diabetes Coordinator Inpatient Diabetes Program [phone] (Team Pager from 8am to 5pm) [phone] ([place] office) [phone] [facility] office)

## 2015-02-28 DIAGNOSIS — N179 Acute kidney failure, unspecified: Principal | ICD-10-CM

## 2015-02-28 LAB — BASIC METABOLIC PANEL
ANION GAP: 7 (ref 5–15)
BUN: 44 mg/dL — ABNORMAL HIGH (ref 6–20)
CALCIUM: 8.3 mg/dL — AB (ref 8.9–10.3)
CHLORIDE: 105 mmol/L (ref 101–111)
CO2: 24 mmol/L (ref 22–32)
Creatinine, Ser: 3.15 mg/dL — ABNORMAL HIGH (ref 0.44–1.00)
GFR calc Af Amer: 15 mL/min — ABNORMAL LOW (ref >60)
GFR calc non Af Amer: 13 mL/min — ABNORMAL LOW (ref >60)
GLUCOSE: 249 mg/dL — AB (ref 65–99)
Potassium: 4 mmol/L (ref 3.5–5.1)
Sodium: 136 mmol/L (ref 135–145)

## 2015-02-28 LAB — CBC
HCT: 30.3 % — ABNORMAL LOW (ref 36.0–46.0)
HEMOGLOBIN: 9.7 g/dL — AB (ref 12.0–15.0)
MCH: 27.2 pg (ref 26.0–34.0)
MCHC: 32 g/dL (ref 30.0–36.0)
MCV: 85.1 fL (ref 78.0–100.0)
Platelets: 301 10*3/uL (ref 150–400)
RBC: 3.56 MIL/uL — AB (ref 3.87–5.11)
RDW: 17.4 % — ABNORMAL HIGH (ref 11.5–15.5)
WBC: 8.2 10*3/uL (ref 4.0–10.5)

## 2015-02-28 LAB — GLUCOSE, CAPILLARY
GLUCOSE-CAPILLARY: 225 mg/dL — AB (ref 65–99)
GLUCOSE-CAPILLARY: 247 mg/dL — AB (ref 65–99)
GLUCOSE-CAPILLARY: 140 mg/dL — AB (ref 65–99)
GLUCOSE-CAPILLARY: 137 mg/dL — AB (ref 65–99)

## 2015-02-28 MED ORDER — ALUM & MAG HYDROXIDE-SIMETH 200-200-20 MG/5ML PO SUSP
30.0000 mL | ORAL | Status: DC | PRN
Start: 2015-02-28 — End: 2015-03-02
  Administered 2015-02-28 – 2015-03-02 (×2): 30 mL via ORAL
  Filled 2015-02-28 (×2): qty 30

## 2015-02-28 MED ORDER — PANTOPRAZOLE SODIUM 40 MG PO TBEC
40.0000 mg | DELAYED_RELEASE_TABLET | Freq: Every day | ORAL | Status: DC
  Administered 2015-02-28 – 2015-03-02 (×3): 40 mg via ORAL
  Filled 2015-02-28 (×3): qty 1

## 2015-02-28 MED ORDER — INSULIN GLARGINE 100 UNIT/ML ~~LOC~~ SOLN
25.0000 [IU] | Freq: Every day | SUBCUTANEOUS | Status: DC
  Administered 2015-03-01: 25 [IU] via SUBCUTANEOUS
  Filled 2015-02-28 (×4): qty 0.25

## 2015-02-28 NOTE — Progress Notes (Signed)
Inpatient Diabetes Program Recommendations  AACE/ADA: New Consensus Statement on Inpatient Glycemic Control (2013)  Target Ranges:  Prepandial:   less than 140 mg/dL      Peak postprandial:   less than 180 mg/dL (1-2 hours)      Critically ill patients:  140 - 180 mg/dL   Results for [name] [name], [name] (MRN [id number]) as of 02/28/2015 11:47  Ref. Range 02/27/2015 07:47 02/27/2015 11:17 02/27/2015 16:25 02/27/2015 21:47 02/28/2015 07:24 02/28/2015 11:16  Glucose-Capillary Latest Ref Range: 65-99 mg/dL 409255 (H) 811248 (H) 914240 (H) 232 (H) 225 (H) 247 (H)   Diabetes history: DM2 Outpatient Diabetes medications: Lantus 21 units daily, Glipizide 5 mg daily Current orders for Inpatient glycemic control: Lantus 21 units daily, Novolog 0-20 units TID with meals, Novolog 0-5 units HS, Novolog 6 units TID with meals  Inpatient Diabetes Program Recommendations Insulin - Basal: Please consider increasing Lantus to 25 units daily. HgbA1C: Please consider adding an A1C to blood in lab to evaluate glycemic control over the past 2-3 months.  Thanks, Orlando PennerMarie Mida Cory, RN, MSN, CCRN, CDE Diabetes Coordinator Inpatient Diabetes Program [phone] (Team Pager from 8am to 5pm) [phone] ([place] office) [phone] [facility] office)

## 2015-02-28 NOTE — Progress Notes (Signed)
[facility] PROGRESS NOTE  [name] [name] ZOX:[id number] DOB: [birth year] DOA: 02/25/2015 PCP: Altamease OilerHARRIS,MEREDITH L, FNP  Assessment/Plan: Right upper quadrant/right flank pain -This has mostly resolved, etiology remains unclear. -Right upper quadrant ultrasound showed cholelithiasis and sludge, HIDA scan was unremarkable. Was seen in consultation by surgery, Dr. Lovell SheehanJenkins, who does not believe the patient has acute cholecystitis. I agree with this assessment.  Acute renal failure -Creatinine has started to improve to 3.15 with fluids. -Recheck renal function in a.m.  Diabetes mellitus -Remains uncontrolled. -We'll increase Lantus to 25 units.  Hypertension -Well-controlled.  GERD -Patient has been regurgitating and is complaining of burning throat and a sour taste in her mouth. -We'll start her on Protonix as well as Maalox.  Code Status: Full code Family Communication: Multiple family members at bedside updated on plan of care  Disposition Plan: To be determined, anticipate home in 48-72 hours   Consultants:  None   Antibiotics:  None   Subjective: Complains of "nausea and vomiting" I have witnessed this and it appears to be more regurgitation of food. Denies chest pain, shortness of breath.  Objective: Filed Vitals:   02/27/15 1533 02/27/15 2150 02/28/15 0605 02/28/15 1436  BP: 140/53 143/48 102/71   Pulse: 73 76 79 76  Temp: 98.4 F (36.9 C) 98.7 F (37.1 C) 97.7 F (36.5 C) 97.9 F (36.6 C)  TempSrc: Oral Oral Oral Oral  Resp: 20 20 21 20   Height:      Weight:      SpO2: 95% 97% 100% 97%    Intake/Output Summary (Last 24 hours) at 02/28/15 1642 Last data filed at 02/28/15 1300  Gross per 24 hour  Intake    720 ml  Output      0 ml  Net    720 ml   Filed Weights   02/25/15 1359  Weight: 120.203 kg (265 lb)    Exam:   General:  Alert, awake, oriented 3  Cardiovascular: Regular rate and rhythm, no murmurs, rubs or  gallops  Respiratory: Clear to auscultation bilaterally  Abdomen: Soft, nontender, nondistended, positive bowel sounds, obese  Extremities: 2+ pitting edema bilaterally   Neurologic:  Grossly intact and nonfocal  Data Reviewed: Basic Metabolic Panel:  Recent Labs Lab 02/25/15 1417 02/26/15 0627 02/27/15 0624 02/27/15 1320 02/28/15 0626  NA 139 140 136 132* 136  K 3.9 4.3 4.3 4.4 4.0  CL 104 105 103 101 105  CO2 27 28 26 24 24   GLUCOSE 175* 202* 279* 293* 249*  BUN 36* 34* 43* 46* 44*  CREATININE 1.94* 1.95* 3.61* 3.85* 3.15*  CALCIUM 9.2 8.9 8.5* 8.3* 8.3*   Liver Function Tests:  Recent Labs Lab 02/25/15 1417 02/26/15 0627  AST 15 13*  ALT 10* 10*  ALKPHOS 92 83  BILITOT 0.5 0.5  PROT 8.6* 7.6  ALBUMIN 3.5 3.1*    Recent Labs Lab 02/25/15 1417  LIPASE 25   No results for input(s): AMMONIA in the last 168 hours. CBC:  Recent Labs Lab 02/25/15 1417 02/26/15 0627 02/27/15 0624 02/28/15 0626  WBC 8.1 9.1 8.6 8.2  NEUTROABS 5.0  --   --   --   HGB 11.2* 10.4* 10.2* 9.7*  HCT 35.6* 34.0* 33.4* 30.3*  MCV 84.4 85.4 85.9 85.1  PLT 344 322 302 301   Cardiac Enzymes: No results for input(s): CKTOTAL, CKMB, CKMBINDEX, TROPONINI in the last 168 hours. BNP (last 3 results) No results for input(s): BNP in the last 8760 hours.  ProBNP (last 3 results) No results for input(s): PROBNP in the last 8760 hours.  CBG:  Recent Labs Lab 02/27/15 1625 02/27/15 2147 02/28/15 0724 02/28/15 1116 02/28/15 1625  GLUCAP 240* 232* 225* 247* 140*    No results found for this or any previous visit (from the past 240 hour(s)).   Studies: [place] Renal  02/27/2015   CLINICAL DATA:  Acute renal failure.  EXAM: RENAL / URINARY TRACT ULTRASOUND COMPLETE  COMPARISON:  CT scan of Feb 25, 2015.  FINDINGS: Right Kidney:  Length: 11.0 cm. Echogenicity within normal limits. No mass or hydronephrosis visualized.  Left Kidney:  Length: 10.9 cm. Echogenicity within normal limits.  No mass or hydronephrosis visualized.  Bladder:  Not well visualized due to lack of distension.  IMPRESSION: Normal renal ultrasound.   Electronically Signed   By: Lupita RaiderJames  Green Jr, M.D.   On: 02/27/2015 16:16    Scheduled Meds: . diltiazem  120 mg Oral QPM  . ferrous gluconate  324 mg Oral Daily  . heparin  5,000 Units Subcutaneous 3 times per day  . insulin aspart  0-20 Units Subcutaneous TID WC  . insulin aspart  0-5 Units Subcutaneous QHS  . insulin aspart  6 Units Subcutaneous TID WC  . insulin glargine  21 Units Subcutaneous Daily  . isosorbide mononitrate  30 mg Oral Daily   Continuous Infusions: . sodium chloride 1 mL (02/28/15 0023)    Principal Problem:   ARF (acute renal failure) Active Problems:   RUQ abdominal pain   Cholelithiasis   Obesity   Diabetes   Acute renal failure (ARF)    Time spent: 30 minutes. Greater than 50% of this time was spent in direct contact with the patient coordinating care.    [name],[name]  [facility] Pager [phone]  If 7PM-7AM, please contact night-coverage at [email or web address], password [email or web address] 02/28/2015, 4:42 PM  LOS: 3 days

## 2015-02-28 NOTE — Progress Notes (Signed)
IV catheter removed by patient. IV catheter intact and catheter site was leaking. Dressing placed over site. Will continue to monitor IV catheter site.

## 2015-03-01 LAB — CBC
HEMATOCRIT: 29.8 % — AB (ref 36.0–46.0)
HEMOGLOBIN: 9.2 g/dL — AB (ref 12.0–15.0)
MCH: 26.2 pg (ref 26.0–34.0)
MCHC: 30.9 g/dL (ref 30.0–36.0)
MCV: 84.9 fL (ref 78.0–100.0)
Platelets: 286 10*3/uL (ref 150–400)
RBC: 3.51 MIL/uL — ABNORMAL LOW (ref 3.87–5.11)
RDW: 17.5 % — AB (ref 11.5–15.5)
WBC: 7.6 10*3/uL (ref 4.0–10.5)

## 2015-03-01 LAB — GLUCOSE, CAPILLARY
GLUCOSE-CAPILLARY: 96 mg/dL (ref 65–99)
Glucose-Capillary: 137 mg/dL — ABNORMAL HIGH (ref 65–99)
Glucose-Capillary: 162 mg/dL — ABNORMAL HIGH (ref 65–99)
Glucose-Capillary: 97 mg/dL (ref 65–99)

## 2015-03-01 LAB — HEMOGLOBIN A1C
Hgb A1c MFr Bld: 10.5 % — ABNORMAL HIGH (ref 4.8–5.6)
Mean Plasma Glucose: 255 mg/dL

## 2015-03-01 LAB — BASIC METABOLIC PANEL
ANION GAP: 6 (ref 5–15)
BUN: 33 mg/dL — AB (ref 6–20)
CO2: 26 mmol/L (ref 22–32)
CREATININE: 2.03 mg/dL — AB (ref 0.44–1.00)
Calcium: 8.2 mg/dL — ABNORMAL LOW (ref 8.9–10.3)
Chloride: 106 mmol/L (ref 101–111)
GFR, EST AFRICAN AMERICAN: 25 mL/min — AB (ref >60)
GFR, EST NON AFRICAN AMERICAN: 22 mL/min — AB (ref >60)
Glucose, Bld: 148 mg/dL — ABNORMAL HIGH (ref 65–99)
Potassium: 3.9 mmol/L (ref 3.5–5.1)
Sodium: 138 mmol/L (ref 135–145)

## 2015-03-01 MED ORDER — INSULIN GLARGINE 100 UNIT/ML ~~LOC~~ SOLN
28.0000 [IU] | Freq: Every day | SUBCUTANEOUS | Status: DC
  Administered 2015-03-02: 28 [IU] via SUBCUTANEOUS
  Filled 2015-03-01 (×5): qty 0.28

## 2015-03-01 NOTE — Progress Notes (Signed)
[facility] PROGRESS NOTE  [name] [name] [id number] DOB: 19-May-1933 DOA: 02/25/2015 PCP: Altamease OilerHARRIS,MEREDITH L, FNP  Assessment/Plan: Right upper quadrant/right flank pain -Mostly resolved, etiology remains unclear. -Right upper quadrant ultrasound shows cholelithiasis and sludge, HIDA scan was unremarkable. No concern for acute cholecystitis.  Acute renal failure -Creatinine has improved to 2.0 today. -Continue IV fluids, recheck renal function in a.m.  Diabetes mellitus -CBGs improved on increased dose of Lantus, will further increase Lantus to 28 units.  Hypertension -Well controlled  GERD -Much improved with Protonix and Maalox.  Code Status: Full code Family Communication: Daughter at bedside  Disposition Plan: Home when ready, anticipate 24-48 hours   Consultants:  None   Antibiotics:  None   Subjective: Nausea, vomiting resolved, still with mild right flank pain. Denies chest pain or shortness of breath.  Objective: Filed Vitals:   02/28/15 1436 02/28/15 1758 02/28/15 2115 03/01/15 0633  BP:  190/71 124/74 147/58  Pulse: 76  86 78  Temp: 97.9 F (36.6 C)  98.6 F (37 C) 97.8 F (36.6 C)  TempSrc: Oral  Oral Oral  Resp: 20  20 20   Height:      Weight:      SpO2: 97%  98% 100%    Intake/Output Summary (Last 24 hours) at 03/01/15 1434 Last data filed at 03/01/15 1002  Gross per 24 hour  Intake   2205 ml  Output    600 ml  Net   1605 ml   Filed Weights   02/25/15 1359  Weight: 120.203 kg (265 lb)    Exam:   General:  Alert, awake, oriented 3  Cardiovascular: Regular rate and rhythm, no murmurs, rubs or gallops  Respiratory: Clear to auscultation bilaterally  Abdomen: Obese, soft, nontender, nondistended, positive bowel sounds  Extremities: 1+ pitting edema bilaterally   Neurologic:  Grossly intact and nonfocal  Data Reviewed: Basic Metabolic Panel:  Recent Labs Lab 02/26/15 0627 02/27/15 0624 02/27/15 1320  02/28/15 0626 03/01/15 0649  NA 140 136 132* 136 138  K 4.3 4.3 4.4 4.0 3.9  CL 105 103 101 105 106  CO2 28 26 24 24 26   GLUCOSE 202* 279* 293* 249* 148*  BUN 34* 43* 46* 44* 33*  CREATININE 1.95* 3.61* 3.85* 3.15* 2.03*  CALCIUM 8.9 8.5* 8.3* 8.3* 8.2*   Liver Function Tests:  Recent Labs Lab 02/25/15 1417 02/26/15 0627  AST 15 13*  ALT 10* 10*  ALKPHOS 92 83  BILITOT 0.5 0.5  PROT 8.6* 7.6  ALBUMIN 3.5 3.1*    Recent Labs Lab 02/25/15 1417  LIPASE 25   No results for input(s): AMMONIA in the last 168 hours. CBC:  Recent Labs Lab 02/25/15 1417 02/26/15 0627 02/27/15 0624 02/28/15 0626 03/01/15 0649  WBC 8.1 9.1 8.6 8.2 7.6  NEUTROABS 5.0  --   --   --   --   HGB 11.2* 10.4* 10.2* 9.7* 9.2*  HCT 35.6* 34.0* 33.4* 30.3* 29.8*  MCV 84.4 85.4 85.9 85.1 84.9  PLT 344 322 302 301 286   Cardiac Enzymes: No results for input(s): CKTOTAL, CKMB, CKMBINDEX, TROPONINI in the last 168 hours. BNP (last 3 results) No results for input(s): BNP in the last 8760 hours.  ProBNP (last 3 results) No results for input(s): PROBNP in the last 8760 hours.  CBG:  Recent Labs Lab 02/28/15 1116 02/28/15 1625 02/28/15 2114 03/01/15 0744 03/01/15 1131  GLUCAP 247* 140* 137* 137* 162*    No results found for this  or any previous visit (from the past 240 hour(s)).   Studies: [place] Renal  02/27/2015   CLINICAL DATA:  Acute renal failure.  EXAM: RENAL / URINARY TRACT ULTRASOUND COMPLETE  COMPARISON:  CT scan of Feb 25, 2015.  FINDINGS: Right Kidney:  Length: 11.0 cm. Echogenicity within normal limits. No mass or hydronephrosis visualized.  Left Kidney:  Length: 10.9 cm. Echogenicity within normal limits. No mass or hydronephrosis visualized.  Bladder:  Not well visualized due to lack of distension.  IMPRESSION: Normal renal ultrasound.   Electronically Signed   By: Lupita RaiderJames  Green Jr, M.D.   On: 02/27/2015 16:16    Scheduled Meds: . diltiazem  120 mg Oral QPM  . ferrous  gluconate  324 mg Oral Daily  . heparin  5,000 Units Subcutaneous 3 times per day  . insulin aspart  0-20 Units Subcutaneous TID WC  . insulin aspart  0-5 Units Subcutaneous QHS  . insulin aspart  6 Units Subcutaneous TID WC  . insulin glargine  25 Units Subcutaneous Daily  . isosorbide mononitrate  30 mg Oral Daily  . pantoprazole  40 mg Oral Daily   Continuous Infusions: . sodium chloride 1 mL (02/28/15 0023)    Principal Problem:   ARF (acute renal failure) Active Problems:   RUQ abdominal pain   Cholelithiasis   Obesity   Diabetes   Acute renal failure (ARF)    Time spent: 25 minutes. Greater than 50% of this time was spent in direct contact with the patient coordinating care.    [name],[name]  [facility] Pager [phone]  If 7PM-7AM, please contact night-coverage at [email or web address], password [email or web address] 03/01/2015, 2:34 PM  LOS: 4 days

## 2015-03-01 NOTE — Care Management Note (Signed)
Case Management Note  Patient Details  Name: [name] [name] MRN: [id number] Date of Birth: 10-01-1933  Expected Discharge Date:  02/28/15               Expected Discharge Plan:  Home w Home Health Services  In-House Referral:  NA  Discharge planning Services  CM Consult  Post Acute Care Choice:    Choice offered to:     DME Arranged:    DME Agency:     HH Arranged:    HH Agency:     Status of Service:  Completed, signed off  Medicare Important Message Given:  Yes Date Medicare IM Given:   03/01/2015 Medicare IM give by:   Kathyrn SheriffJessica Renell Coaxum, RN, MSN, CM  Date Additional Medicare IM Given:    Additional Medicare Important Message give by:     If discussed at Long Length of Stay Meetings, dates discussed:    Additional Comments: Pt anticipates DC home today with self care. Pt and MD feel no HH is needed. No CM needs.  [name] [name], [name] Mccloud Demske, RN 03/01/2015, 2:37 PM

## 2015-03-02 LAB — CBC
HCT: 30.6 % — ABNORMAL LOW (ref 36.0–46.0)
HEMOGLOBIN: 9.6 g/dL — AB (ref 12.0–15.0)
MCH: 27 pg (ref 26.0–34.0)
MCHC: 31.4 g/dL (ref 30.0–36.0)
MCV: 86.2 fL (ref 78.0–100.0)
PLATELETS: 288 10*3/uL (ref 150–400)
RBC: 3.55 MIL/uL — ABNORMAL LOW (ref 3.87–5.11)
RDW: 17.7 % — AB (ref 11.5–15.5)
WBC: 8.2 10*3/uL (ref 4.0–10.5)

## 2015-03-02 LAB — GLUCOSE, CAPILLARY
Glucose-Capillary: 148 mg/dL — ABNORMAL HIGH (ref 65–99)
Glucose-Capillary: 84 mg/dL (ref 65–99)

## 2015-03-02 LAB — BASIC METABOLIC PANEL
ANION GAP: 5 (ref 5–15)
BUN: 25 mg/dL — ABNORMAL HIGH (ref 6–20)
CHLORIDE: 107 mmol/L (ref 101–111)
CO2: 25 mmol/L (ref 22–32)
CREATININE: 1.62 mg/dL — AB (ref 0.44–1.00)
Calcium: 8.3 mg/dL — ABNORMAL LOW (ref 8.9–10.3)
GFR calc Af Amer: 33 mL/min — ABNORMAL LOW (ref >60)
GFR calc non Af Amer: 29 mL/min — ABNORMAL LOW (ref >60)
Glucose, Bld: 154 mg/dL — ABNORMAL HIGH (ref 65–99)
Potassium: 4.3 mmol/L (ref 3.5–5.1)
Sodium: 137 mmol/L (ref 135–145)

## 2015-03-02 MED ORDER — INSULIN GLARGINE 100 UNIT/ML ~~LOC~~ SOLN: 28.0000 [IU] | Freq: Every day | SUBCUTANEOUS | Status: DC

## 2015-03-02 MED ORDER — OXYCODONE HCL 5 MG PO TABS: 5.0000 mg | ORAL_TABLET | ORAL | Status: DC | PRN

## 2015-03-02 NOTE — Discharge Instructions (Signed)
Acute Kidney Injury Acute kidney injury is a disease in which there is sudden (acute) damage to the kidneys. The kidneys are 2 organs that lie on either side of the spine between the middle of the back and the front of the abdomen. The kidneys:  Remove wastes and extra water from the blood.   Produce important hormones. These help keep bones strong, regulate blood pressure, and help create red blood cells.   Balance the fluids and chemicals in the blood and tissues. A small amount of kidney damage may not cause problems, but a large amount of damage may make it difficult or impossible for the kidneys to work the way they should. Acute kidney injury may develop into long-lasting (chronic) kidney disease. It may also develop into a life-threatening disease called end-stage kidney disease. Acute kidney injury can get worse very quickly, so it should be treated right away. Early treatment may prevent other kidney diseases from developing.  CAUSES   A problem with blood flow to the kidneys. This may be caused by:   Blood loss.   Heart disease.   Severe burns.   Liver disease.  Direct damage to the kidneys. This may be caused by:  Some medicines.   A kidney infection.   Poisoning or consuming toxic substances.   A surgical wound.   A blow to the kidney area.   A problem with urine flow. This may be caused by:   Cancer.   Kidney stones.   An enlarged prostate. SYMPTOMS   Swelling (edema) of the legs, ankles, or feet.   Tiredness (lethargy).   Nausea or vomiting.   Confusion.   Problems with urination, such as:   Painful or burning feeling during urination.   Decreased urine production.   Frequent accidents in children who are potty trained.   Bloody urine.   Muscle twitches and cramps.   Shortness of breath.   Seizures.   Chest pain or pressure. Sometimes, no symptoms are present. DIAGNOSIS Acute kidney injury may be detected  and diagnosed by tests, including blood, urine, imaging, or kidney biopsy tests.  TREATMENT Treatment of acute kidney injury varies depending on the cause and severity of the kidney damage. In mild cases, no treatment may be needed. The kidneys may heal on their own. If acute kidney injury is more severe, your caregiver will treat the cause of the kidney damage, help the kidneys heal, and prevent complications from occurring. Severe cases may require a procedure to remove toxic wastes from the body (dialysis) or surgery to repair kidney damage. Surgery may involve:   Repair of a torn kidney.   Removal of an obstruction. Most of the time, you will need to stay overnight at the hospital.  HOME CARE INSTRUCTIONS:  Follow your prescribed diet.  Only take over-the-counter or prescription medicines as directed by your caregiver.  Do not take any new medicines (prescription, over-the-counter, or nutritional supplements) unless approved by your caregiver. Many medicines can worsen your kidney damage or need to have the dose adjusted.   Keep all follow-up appointments as directed by your caregiver.  Observe your condition to make sure you are healing as expected. SEEK IMMEDIATE MEDICAL CARE IF:  You are feeling ill or have severe pain in the back or side.   Your symptoms return or you have new symptoms.  You have any symptoms of end-stage kidney disease. These include:   Persistent itchiness.   Loss of appetite.   Headaches.   Abnormally dark   or light skin.  Numbness in the hands or feet.   Easy bruising.   Frequent hiccups.   Menstruation stops.   You have a fever.  You have increased urine production.  You have pain or bleeding when urinating. MAKE SURE YOU:   Understand these instructions.  Will watch your condition.  Will get help right away if you are not doing well or get worse Document Released: 04/13/2011 Document Revised: 01/23/2013 Document  Reviewed: 05/27/2012 ExitCare Patient Information 2015 [employer]. This information is not intended to replace advice given to you by your health care provider. Make sure you discuss any questions you have with your health care provider.  

## 2015-03-02 NOTE — Discharge Summary (Signed)
Physician Discharge Summary  [name] [name] ZOX:[id number] DOB: June 13, 1933 DOA: 02/25/2015  PCP: Altamease Oiler, FNP  Admit date: 02/25/2015 Discharge date: 03/02/2015  Time spent: 45 minutes  Recommendations for Outpatient Follow-up:  -Will be discharged home today. -Advised to follow-up with PCP in 2 weeks.   Discharge Diagnoses:  Principal Problem:   ARF (acute renal failure) Active Problems:   RUQ abdominal pain   Cholelithiasis   Obesity   Diabetes   Acute renal failure (ARF)   Discharge Condition: Stable and improved  Filed Weights   02/25/15 1359  Weight: 120.203 kg (265 lb)    History of present illness:  This is an 81 year old lady who is had right-sided abdominal pain, mainly in the right upper quadrant, for the last 1 week. This has been associated with nausea and on some occasions vomiting also. The pain is colicky in nature and is quite severe when present. It does not radiate apart from being in the right abdomen. She denies any associated fever. She has not noticed any diarrhea or change in stool color. She is diabetic. She has hypertension. Investigation in the emergency room shows the presence of cholelithiasis without any evidence of cholecystitis. She is now being admitted for further management.  Hospital Course:   Right upper quadrant/right flank pain -Moser resolved, etiology remains unclear. -Right upper quadrant ultrasound showed cholelithiasis and sludge, HIDA scan was unremarkable, no concern for acute cholecystitis. -Was seen in consultation by surgery, Dr. Lovell Sheehan, who agreed no surgical intervention required and this was not likely to be cholecystitis.  Acute renal failure -Creatinine has returned to baseline upon discharge. -Baseline creatinine is around 1.5-1.7. 1.62 on discharge.  Diabetes mellitus -Much improved on increased dose of Lantus.  Hypertension -Well controlled  GERD -Improved on Protonix.  Procedures:  None    Consultations:  Surgery, Dr. Lovell Sheehan  Discharge Instructions  Discharge Instructions    Diet - low sodium heart healthy    Complete by:  As directed      Increase activity slowly    Complete by:  As directed             Medication List    STOP taking these medications        furosemide 80 MG tablet  Commonly known as:  LASIX     LANTUS SOLOSTAR 100 UNIT/ML Solostar Pen  Generic drug:  Insulin Glargine  Replaced by:  insulin glargine 100 UNIT/ML injection     lisinopril 20 MG tablet  Commonly known as:  PRINIVIL,ZESTRIL     Vitamin D (Ergocalciferol) 50000 UNITS Caps capsule  Commonly known as:  DRISDOL      TAKE these medications        aspirin EC 81 MG tablet  Take 81 mg by mouth daily.     atorvastatin 20 MG tablet  Commonly known as:  LIPITOR  Take 20 mg by mouth at bedtime.     CALCIUM 600 + D PO  Take 1 tablet by mouth daily.     diltiazem 120 MG 12 hr capsule  Commonly known as:  CARDIZEM SR  Take 120 mg by mouth every evening.     ferrous gluconate 324 MG tablet  Commonly known as:  FERGON  Take 324 mg by mouth daily.     glipiZIDE 5 MG tablet  Commonly known as:  GLUCOTROL  Take 5 mg by mouth daily.     insulin glargine 100 UNIT/ML injection  Commonly known as:  LANTUS  Inject 0.28 mLs (28 Units total) into the skin daily.     oxyCODONE 5 MG immediate release tablet  Commonly known as:  Oxy IR/ROXICODONE  Take 1 tablet (5 mg total) by mouth every 4 (four) hours as needed for severe pain.       No Known Allergies     Follow-up Information    Follow up with HARRIS,MEREDITH L, FNP. Schedule an appointment as soon as possible for a visit in 1 week.   Specialty:  Family Medicine   Contact information:   [address] [place] [phone]        The results of significant diagnostics from this hospitalization (including imaging, microbiology, ancillary and laboratory) are listed below for reference.     Significant Diagnostic Studies: Ct Abdomen Pelvis Wo Contrast  02/25/2015   CLINICAL DATA:  81 year old female with right lower quadrant and right flank pain for the past week accompanied by nausea.  EXAM: CT ABDOMEN AND PELVIS WITHOUT CONTRAST  TECHNIQUE: Multidetector CT imaging of the abdomen and pelvis was performed following the standard protocol without IV contrast.  COMPARISON:  Prior right upper quadrant abdominal ultrasound obtained earlier today  FINDINGS: Lower Chest: Nonspecific 4 mm pulmonary nodule in the right lower lobe (image 5 series 3) small foci of tree-in-bud micro nodularity in the periphery of the inferior left lower lobe. Otherwise, the lungs are clear. Visualized cardiac structures are within normal limits for size. Small volume pericardial effusion. Small hiatal hernia.  Abdomen: Unenhanced CT was performed per clinician order. Lack of IV contrast limits sensitivity and specificity, especially for evaluation of abdominal/pelvic solid viscera. Within these limitations, unremarkable CT appearance of the stomach, duodenum, spleen, adrenal glands and pancreas. Normal hepatic contour and morphology. No discrete hepatic lesion. Small gallstones layer within the gallbladder neck. No secondary evidence of acute cholecystitis.  Unremarkable appearance of the bilateral kidneys. No focal solid lesion, hydronephrosis or nephrolithiasis.  No evidence of obstruction or focal bowel wall thickening. Normal appendix in the right lower quadrant. The terminal ileum is unremarkable. No free fluid or suspicious adenopathy.  Pelvis: Unremarkable bladder, uterus and adnexa. No free fluid or suspicious adenopathy.  Bones/Soft Tissues: No acute fracture or aggressive appearing lytic or blastic osseous lesion. Incompletely imaged left hip arthroplasty prosthesis. Multilevel degenerative disc disease.  Vascular: Atherosclerotic vascular disease without significant stenosis or aneurysmal dilatation.  IMPRESSION:  1. No acute abnormality in the abdomen or pelvis to explain the patient's clinical symptoms. 2. With patchy tree-in-bud micro nodularity in the periphery of the inferior left lower lobe may reflect an acute infectious/inflammatory process, or a chronic indolent atypical infection such as MAI. 3. Nonspecific 4 mm right lower lobe pulmonary nodule. If the patient is at high risk for bronchogenic carcinoma, follow-up chest CT at 1 year is recommended. If the patient is at low risk, no follow-up is needed. This recommendation follows the consensus statement: Guidelines for Management of Small Pulmonary Nodules Detected on CT Scans: A Statement from the Fleischner Society as published in Radiology 2005; 237:395-400. 4. Cholelithiasis. 5. Scattered atherosclerotic vascular calcifications. 6. Incompletely imaged left hip arthroplasty prosthesis.   Electronically Signed   By: Malachy MoanHeath  McCullough M.D.   On: 02/25/2015 19:21   [place] Renal  02/27/2015   CLINICAL DATA:  Acute renal failure.  EXAM: RENAL / URINARY TRACT ULTRASOUND COMPLETE  COMPARISON:  CT scan of Feb 25, 2015.  FINDINGS: Right Kidney:  Length: 11.0 cm. Echogenicity within normal limits. No mass or  hydronephrosis visualized.  Left Kidney:  Length: 10.9 cm. Echogenicity within normal limits. No mass or hydronephrosis visualized.  Bladder:  Not well visualized due to lack of distension.  IMPRESSION: Normal renal ultrasound.   Electronically Signed   By: Lupita Raider, M.D.   On: 02/27/2015 16:16   Nm Hepato W/eject Fract  02/26/2015   CLINICAL DATA:  Right abdominal pain for the past week.  EXAM: NUCLEAR MEDICINE HEPATOBILIARY IMAGING WITH GALLBLADDER EF  TECHNIQUE: Sequential images of the abdomen were obtained out to 60 minutes following intravenous administration of radiopharmaceutical. After slow intravenous infusion of 2.41 micrograms Cholecystokinin, gallbladder ejection fraction was determined.  RADIOPHARMACEUTICALS:  5 Technetium-84m Choletec IV   COMPARISON:  The abdomen CT dated 02/25/2015.  FINDINGS: Normal visualization of the liver, bile ducts, gallbladder and small bowel. The gallbladder ejection fraction is 45%. At 45 min, normal ejection fraction is greater than 40%.  IMPRESSION: Normal examination.   Electronically Signed   By: Beckie Salts M.D.   On: 02/26/2015 10:36   US Abdomen Limited Ruq  02/25/2015   CLINICAL DATA:  RIGHT upper quadrant pain for 1 week, nausea, hypertension, diabetes  EXAM: US ABDOMEN LIMITED - RIGHT UPPER QUADRANT  COMPARISON:  None  FINDINGS: Gallbladder:  Sludge and shadowing calculi in gallbladder, largest defined stone 12 mm diameter. Upper normal gallbladder wall thickness. No pericholecystic fluid or sonographic Murphy sign.  Common bile duct:  Diameter: 3 mm diameter, normal  Liver:  Echogenic, likely fatty infiltration, though this can be seen with cirrhosis and certain infiltrative disorders. No focal hepatic mass or nodularity. Hepatopetal portal venous flow.  No RIGHT upper quadrant ascites.  IMPRESSION: Sludge and calculi within gallbladder without definite evidence of acute cholecystitis or biliary obstruction.  Probable fatty infiltration of liver as above.   Electronically Signed   By: Ulyses Southward M.D.   On: 02/25/2015 18:00    Microbiology: No results found for this or any previous visit (from the past 240 hour(s)).   Labs: Basic Metabolic Panel:  Recent Labs Lab 02/27/15 0624 02/27/15 1320 02/28/15 0626 03/01/15 0649 03/02/15 0645  NA 136 132* 136 138 137  K 4.3 4.4 4.0 3.9 4.3  CL 103 101 105 106 107  CO2 GLUCOSE 279* 293* 249* 148* 154*  BUN 43* 46* 44* 33* 25*  CREATININE 3.61* 3.85* 3.15* 2.03* 1.62*  CALCIUM 8.5* 8.3* 8.3* 8.2* 8.3*   Liver Function Tests:  Recent Labs Lab 02/25/15 1417 02/26/15 0627  AST 15 13*  ALT 10* 10*  ALKPHOS 92 83  BILITOT 0.5 0.5  PROT 8.6* 7.6  ALBUMIN 3.5 3.1*    Recent Labs Lab 02/25/15 1417  LIPASE 25   No  results for input(s): AMMONIA in the last 168 hours. CBC:  Recent Labs Lab 02/25/15 1417 02/26/15 0627 02/27/15 0624 02/28/15 0626 03/01/15 0649 03/02/15 0645  WBC 8.1 9.1 8.6 8.2 7.6 8.2  NEUTROABS 5.0  --   --   --   --   --   HGB 11.2* 10.4* 10.2* 9.7* 9.2* 9.6*  HCT 35.6* 34.0* 33.4* 30.3* 29.8* 30.6*  MCV 84.4 85.4 85.9 85.1 84.9 86.2  PLT 344 322 302 301 286 288   Cardiac Enzymes: No results for input(s): CKTOTAL, CKMB, CKMBINDEX, TROPONINI in the last 168 hours. BNP: BNP (last 3 results) No results for input(s): BNP in the last 8760 hours.  ProBNP (last 3 results) No results for input(s): PROBNP in the last 8760  hours.  CBG:  Recent Labs Lab 03/01/15 1131 03/01/15 1657 03/01/15 2212 03/02/15 0728 03/02/15 1153  GLUCAP 162* 97 96 148* 84       Signed:  HERNANDEZ ACOSTA,[name]  [facility] Pager: [phone] 03/02/2015, 3:40 PM

## 2015-03-02 NOTE — Progress Notes (Signed)
NURSING PROGRESS NOTE  Webb LawsOla R Holstein [id number] Discharge Data: 03/02/2015 2:19 PM Attending Provider: No att. providers found WUJ:[id number]:HARRIS,MEREDITH L, FNP   Romilda R Soltau to be D/C'd Home per MD order.    All IV's discontinued and monitored for bleeding.  All belongings returned to patient for patient to take home.  AVS summary and prescriptions reviewed with patient and family.  Patient left floor via wheelchair, escorted by NT.  Last Documented Vital Signs:  Blood pressure 150/42, pulse 84, temperature 98.5 F (36.9 C), temperature source Oral, resp. rate 18, height 5\' 2"  (1.575 m), weight 120.203 kg (265 lb), SpO2 94 %.  Mertha BaarsHorine, Kerolos Nehme D

## 2018-06-26 ENCOUNTER — Encounter (HOSPITAL_COMMUNITY): Payer: Self-pay | Admitting: Emergency Medicine

## 2018-06-26 ENCOUNTER — Emergency Department (HOSPITAL_COMMUNITY): Payer: Medicare HMO

## 2018-06-26 ENCOUNTER — Emergency Department (HOSPITAL_COMMUNITY)
Admission: EM | Admit: 2018-06-26 | Discharge: 2018-06-26 | Disposition: A | Discharge status: Home / Self Care | Payer: Medicare HMO | Attending: Emergency Medicine | Admitting: Emergency Medicine

## 2018-06-26 ENCOUNTER — Other Ambulatory Visit: Payer: Self-pay

## 2018-06-26 DIAGNOSIS — I130 Hypertensive heart and chronic kidney disease with heart failure and stage 1 through stage 4 chronic kidney disease, or unspecified chronic kidney disease: Secondary | ICD-10-CM | POA: Insufficient documentation

## 2018-06-26 DIAGNOSIS — N189 Chronic kidney disease, unspecified: Secondary | ICD-10-CM | POA: Diagnosis not present

## 2018-06-26 DIAGNOSIS — E1122 Type 2 diabetes mellitus with diabetic chronic kidney disease: Secondary | ICD-10-CM | POA: Insufficient documentation

## 2018-06-26 DIAGNOSIS — I509 Heart failure, unspecified: Secondary | ICD-10-CM

## 2018-06-26 DIAGNOSIS — Z79899 Other long term (current) drug therapy: Secondary | ICD-10-CM | POA: Insufficient documentation

## 2018-06-26 DIAGNOSIS — Z7982 Long term (current) use of aspirin: Secondary | ICD-10-CM | POA: Diagnosis not present

## 2018-06-26 DIAGNOSIS — R0602 Shortness of breath: Secondary | ICD-10-CM | POA: Diagnosis present

## 2018-06-26 DIAGNOSIS — Z794 Long term (current) use of insulin: Secondary | ICD-10-CM | POA: Insufficient documentation

## 2018-06-26 HISTORY — DX: Chronic kidney disease, unspecified: N18.9

## 2018-06-26 HISTORY — DX: Heart failure, unspecified: I50.9

## 2018-06-26 HISTORY — DX: Unspecified atrial fibrillation: I48.91

## 2018-06-26 HISTORY — DX: Disorder of kidney and ureter, unspecified: N28.9

## 2018-06-26 LAB — BASIC METABOLIC PANEL
Anion gap: 7 (ref 5–15)
BUN: 51 mg/dL — ABNORMAL HIGH (ref 8–23)
CO2: 25 mmol/L (ref 22–32)
Calcium: 8.6 mg/dL — ABNORMAL LOW (ref 8.9–10.3)
Chloride: 104 mmol/L (ref 98–111)
Creatinine, Ser: 2.67 mg/dL — ABNORMAL HIGH (ref 0.44–1.00)
GFR calc Af Amer: 18 mL/min — ABNORMAL LOW (ref >60)
GFR calc non Af Amer: 15 mL/min — ABNORMAL LOW (ref >60)
Glucose, Bld: 289 mg/dL — ABNORMAL HIGH (ref 70–99)
Potassium: 3.9 mmol/L (ref 3.5–5.1)
Sodium: 136 mmol/L (ref 135–145)

## 2018-06-26 LAB — CBC WITH DIFFERENTIAL/PLATELET
Basophils Absolute: 0 10*3/uL (ref 0.0–0.1)
Basophils Relative: 0 %
Eosinophils Absolute: 0.1 10*3/uL (ref 0.0–0.7)
Eosinophils Relative: 1 %
HCT: 26.3 % — ABNORMAL LOW (ref 36.0–46.0)
Hemoglobin: 8.1 g/dL — ABNORMAL LOW (ref 12.0–15.0)
Lymphocytes Relative: 21 %
Lymphs Abs: 1.3 10*3/uL (ref 0.7–4.0)
MCH: 27.3 pg (ref 26.0–34.0)
MCHC: 30.8 g/dL (ref 30.0–36.0)
MCV: 88.6 fL (ref 78.0–100.0)
Monocytes Absolute: 0.2 10*3/uL (ref 0.1–1.0)
Monocytes Relative: 4 %
Neutro Abs: 4.7 10*3/uL (ref 1.7–7.7)
Neutrophils Relative %: 74 %
Platelets: 256 10*3/uL (ref 150–400)
RBC: 2.97 MIL/uL — ABNORMAL LOW (ref 3.87–5.11)
RDW: 20.7 % — ABNORMAL HIGH (ref 11.5–15.5)
WBC: 6.3 10*3/uL (ref 4.0–10.5)

## 2018-06-26 LAB — BRAIN NATRIURETIC PEPTIDE: B Natriuretic Peptide: 313 pg/mL — ABNORMAL HIGH (ref 0.0–100.0)

## 2018-06-26 LAB — URINALYSIS, ROUTINE W REFLEX MICROSCOPIC
Bilirubin Urine: NEGATIVE
Glucose, UA: NEGATIVE mg/dL
Ketones, ur: NEGATIVE mg/dL
Leukocytes, UA: NEGATIVE
Nitrite: NEGATIVE
Protein, ur: NEGATIVE mg/dL
Specific Gravity, Urine: 1.008 (ref 1.005–1.030)
pH: 5 (ref 5.0–8.0)

## 2018-06-26 LAB — TROPONIN I: Troponin I: <0.03 ng/mL (ref <0.03)

## 2018-06-26 MED ORDER — FUROSEMIDE 40 MG PO TABS: 80.0000 mg | ORAL_TABLET | Freq: Two times a day (BID) | ORAL | 0 refills | Status: AC

## 2018-06-26 MED ORDER — POTASSIUM CHLORIDE CRYS ER 20 MEQ PO TBCR: 20.0000 meq | EXTENDED_RELEASE_TABLET | Freq: Every day | ORAL | 0 refills | Status: AC

## 2018-06-26 MED ORDER — POTASSIUM CHLORIDE CRYS ER 20 MEQ PO TBCR
40.0000 meq | EXTENDED_RELEASE_TABLET | Freq: Once | ORAL | Status: AC
Start: 2018-06-26 — End: 2018-06-26
  Administered 2018-06-26: 40 meq via ORAL
  Filled 2018-06-26: qty 2

## 2018-06-26 MED ORDER — FUROSEMIDE 10 MG/ML IJ SOLN
60.0000 mg | Freq: Once | INTRAMUSCULAR | Status: AC
  Administered 2018-06-26: 60 mg via INTRAVENOUS
  Filled 2018-06-26: qty 6

## 2018-06-26 NOTE — ED Provider Notes (Signed)
[facility] Provider Note   CSN: [id number] Arrival date & time: 06/26/18  1602     History   Chief Complaint Chief Complaint  Patient presents with  . Shortness of Breath    HPI [name] [name] is a 84 y.o. female.  HPI   84 year old female with dyspnea.  Worsening over the past week or so.  She has been having to sleep in a recliner.  Progressive lower extremity edema.  No fevers or chills.  No cough.  Denies any acute pain.  She reports compliance with her medications.  Past Medical History:  Diagnosis Date  . A-fib (HCC)   . CHF (congestive heart failure) (HCC)   . Chronic kidney disease   . Diabetes mellitus without complication (HCC)   . Hypertension   . Irregular heart beat   . Obesity 02/25/2015  . Renal disorder     Patient Active Problem List   Diagnosis Date Noted  . Acute renal failure (ARF) (HCC) 02/28/2015  . ARF (acute renal failure) (HCC) 02/27/2015  . RUQ abdominal pain 02/25/2015  . Cholelithiasis 02/25/2015  . Obesity 02/25/2015  . Diabetes (HCC) 02/25/2015    Past Surgical History:  Procedure Laterality Date  . FRACTURE SURGERY    . HIP FRACTURE SURGERY    . TUBAL LIGATION       OB History    Gravida  8   Para  8   Term  8   Preterm      AB      Living  8     SAB      TAB      Ectopic      Multiple      Live Births               Home Medications    Prior to Admission medications   Medication Sig Start Date End Date Taking? Authorizing Provider  acetaminophen (TYLENOL) 500 MG tablet Take 500 mg by mouth every 6 (six) hours as needed for mild pain or moderate pain.   Yes [provider]  aspirin EC 81 MG tablet Take 81 mg by mouth daily.   Yes [provider]  atorvastatin (LIPITOR) 20 MG tablet Take 20 mg by mouth at bedtime.   Yes [provider]  Calcium Carb-Cholecalciferol (CALCIUM 600 + D PO) Take 1 tablet by mouth daily.   Yes [provider]    diltiazem (CARDIZEM SR) 120 MG 12 hr capsule Take 120 mg by mouth 2 (two) times daily.    Yes [provider]  furosemide (LASIX) 80 MG tablet Take 80 mg by mouth daily. *May take one additional dose as needed for fluid retention   Yes [provider]  glipiZIDE (GLUCOTROL) 5 MG tablet Take 5 mg by mouth daily.   Yes [provider]  insulin aspart (NOVOLOG FLEXPEN) 100 UNIT/ML FlexPen Inject 5 Units into the skin 3 (three) times daily after meals. *Takes as needed   Yes [provider]  insulin glargine (LANTUS) 100 unit/mL SOPN Inject 35 Units into the skin every morning.    Yes [provider]  lisinopril (PRINIVIL,ZESTRIL) 20 MG tablet Take 20 mg by mouth daily.   Yes [provider]    Family History No family history on file.  Social History Social History   Tobacco Use  . Smoking status: Never Smoker  . Smokeless tobacco: Never Used  Substance Use Topics  . Alcohol use: No  .  Drug use: No     Allergies   Patient has no known allergies.   Review of Systems Review of Systems All systems reviewed and negative, other than as noted in HPI.   Physical Exam Updated Vital Signs BP (!) 145/52 (BP Location: Left Arm)   Pulse 70   Temp 98.1 F (36.7 C) (Oral)   Resp 18   Ht 5\' 3"  (1.6 m)   Wt 128.4 kg   SpO2 100%   BMI 50.13 kg/m   Physical Exam  Constitutional: She appears well-developed and well-nourished. No distress.  HENT:  Head: Normocephalic and atraumatic.  Eyes: Conjunctivae are normal. Right eye exhibits no discharge. Left eye exhibits no discharge.  Neck: Neck supple.  Cardiovascular: Normal rate, regular rhythm and normal heart sounds. Exam reveals no gallop and no friction rub.  No murmur heard. Pulmonary/Chest: Effort normal. No respiratory distress.  Decreased breath sounds bilaterally crackles at bases.  Abdominal: Soft. She exhibits no distension. There is no tenderness.  Musculoskeletal: She  exhibits edema. She exhibits no tenderness.  Neurological: She is alert.  Skin: Skin is warm and dry.  Psychiatric: She has a normal mood and affect. Her behavior is normal. Thought content normal.  Nursing note and vitals reviewed.    ED Treatments / Results  Labs (all labs ordered are listed, but only abnormal results are displayed) Labs Reviewed  BRAIN NATRIURETIC PEPTIDE - Abnormal; Notable for the following components:      Result Value   B Natriuretic Peptide 313.0 (*)    All other components within normal limits  CBC WITH DIFFERENTIAL/PLATELET - Abnormal; Notable for the following components:   RBC 2.97 (*)    Hemoglobin 8.1 (*)    HCT 26.3 (*)    RDW 20.7 (*)    All other components within normal limits  BASIC METABOLIC PANEL - Abnormal; Notable for the following components:   Glucose, Bld 289 (*)    BUN 51 (*)    Creatinine, Ser 2.67 (*)    Calcium 8.6 (*)    GFR calc non Af Amer 15 (*)    GFR calc Af Amer 18 (*)    All other components within normal limits  URINALYSIS, ROUTINE W REFLEX MICROSCOPIC - Abnormal; Notable for the following components:   Color, Urine STRAW (*)    Hgb urine dipstick SMALL (*)    Bacteria, UA RARE (*)    All other components within normal limits  TROPONIN I    EKG EKG Interpretation  Date/Time:  Sunday June 26 2018 16:13:16 EDT Ventricular Rate:  92 PR Interval:    QRS Duration: 56 QT Interval:  439 QTC Calculation: 544 R Axis:   99 Text Interpretation:  Atrial fibrillation Right axis deviation Low voltage, precordial leads Prolonged QT interval Confirmed by Eber Hong ([id number]) on 06/29/2018 10:56:34 AM   Radiology Dg Chest 2 View  Result Date: 06/26/2018 CLINICAL DATA:  Patient with shortness of breath. EXAM: CHEST - 2 VIEW COMPARISON:  Chest radiograph 05/26/2009 FINDINGS: Monitoring leads overlie the patient. Stable cardiomegaly. Diffuse bilateral interstitial pulmonary opacities. No pleural effusion. Thoracic spine  degenerative changes. IMPRESSION: Cardiomegaly with mild interstitial edema. Electronically Signed   By: Annia Belt M.D.   On: 06/26/2018 17:37    Procedures Procedures (including critical care time)  Medications Ordered in ED Medications  furosemide (LASIX) injection 60 mg (has no administration in time range)     Initial Impression / Assessment and Plan / ED Course  I have  reviewed the triage vital signs and the nursing notes.  Pertinent labs & imaging results that were available during my care of the patient were reviewed by me and considered in my medical decision making (see chart for details).     84 year old female with shortness of breath.  Clinically heart failure.  Increasing edema.  BNP is elevated.  Chest x-ray is consistent as well.  Also anemic with hemoglobin 8.1.  Chronic to some degree.  Last hemoglobin from several years ago was 9.6.  O2 sats are normal on room air.  Work of breathing is not sniffily increased at least at rest.  We will have her increase her Lasix.  Close PCP follow-up.  Return precautions discussed.  Final Clinical Impressions(s) / ED Diagnoses   Final diagnoses:  Acute on chronic congestive heart failure, unspecified heart failure type [facility])    ED Discharge Orders    None       Raeford Razor, MD 07/08/18 1624

## 2018-06-26 NOTE — ED Notes (Signed)
Purewick on pt.

## 2018-06-26 NOTE — ED Notes (Signed)
Received report on pt, pt lying semi fowler's in bed, update given, pt denies any complaints, states that she is feeling better,

## 2018-06-26 NOTE — ED Triage Notes (Signed)
Patient brought in via EMS from home. Alert and oriented. Airway patent. Patient c/o shortness of breath x1 week that has progressively gotten worse. Patient reports having to sleep in recliner and swelling in lower extremities. Denies any chest pain. Per patient hx of a-fib and CHF. Patient reports taking Lasix daily.

## 2018-06-26 NOTE — Discharge Instructions (Addendum)
Increase lasix (furosemide) to 80 mg twice/day for 1 week and them resume previous dosing of once per day.

## 2019-04-12 DEATH — deceased
